# Patient Record
Sex: Female | Born: 1980 | Race: White | Hispanic: No | Marital: Single | Smoking: Current every day smoker
Health system: Southern US, Community
[De-identification: ages and names within clinical notes are randomized; demographics above are authoritative.]

## PROBLEM LIST (undated history)

## (undated) ENCOUNTER — Inpatient Hospital Stay (HOSPITAL_COMMUNITY): Payer: Self-pay

## (undated) DIAGNOSIS — R87619 Unspecified abnormal cytological findings in specimens from cervix uteri: Secondary | ICD-10-CM

## (undated) DIAGNOSIS — R87629 Unspecified abnormal cytological findings in specimens from vagina: Secondary | ICD-10-CM

## (undated) DIAGNOSIS — R51 Headache: Secondary | ICD-10-CM

## (undated) DIAGNOSIS — I499 Cardiac arrhythmia, unspecified: Secondary | ICD-10-CM

## (undated) DIAGNOSIS — Z3483 Encounter for supervision of other normal pregnancy, third trimester: Secondary | ICD-10-CM

## (undated) DIAGNOSIS — IMO0002 Reserved for concepts with insufficient information to code with codable children: Secondary | ICD-10-CM

## (undated) DIAGNOSIS — F419 Anxiety disorder, unspecified: Secondary | ICD-10-CM

## (undated) DIAGNOSIS — R519 Headache, unspecified: Secondary | ICD-10-CM

## (undated) DIAGNOSIS — C801 Malignant (primary) neoplasm, unspecified: Secondary | ICD-10-CM

## (undated) DIAGNOSIS — Z9851 Tubal ligation status: Secondary | ICD-10-CM

## (undated) HISTORY — PX: NO PAST SURGERIES: SHX2092

## (undated) HISTORY — PX: CARDIAC ELECTROPHYSIOLOGY MAPPING AND ABLATION: SHX1292

## (undated) HISTORY — DX: Headache: R51

## (undated) HISTORY — PX: GYNECOLOGIC CRYOSURGERY: SHX857

## (undated) HISTORY — DX: Unspecified abnormal cytological findings in specimens from vagina: R87.629

## (undated) HISTORY — DX: Headache, unspecified: R51.9

---
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
## 2001-02-07 ENCOUNTER — Emergency Department (HOSPITAL_COMMUNITY): Admission: EM | Admit: 2001-02-07 | Discharge: 2001-02-08 | Payer: Self-pay | Admitting: Emergency Medicine

## 2001-02-07 ENCOUNTER — Encounter: Payer: Self-pay | Admitting: Emergency Medicine

## 2001-10-15 ENCOUNTER — Other Ambulatory Visit: Admission: RE | Admit: 2001-10-15 | Discharge: 2001-10-15 | Payer: Self-pay | Admitting: Gynecology

## 2002-04-20 ENCOUNTER — Other Ambulatory Visit: Admission: RE | Admit: 2002-04-20 | Discharge: 2002-04-20 | Payer: Self-pay | Admitting: Gynecology

## 2005-02-01 ENCOUNTER — Emergency Department (HOSPITAL_COMMUNITY): Admission: EM | Admit: 2005-02-01 | Discharge: 2005-02-01 | Payer: Self-pay | Admitting: Emergency Medicine

## 2005-02-20 ENCOUNTER — Encounter: Admission: RE | Admit: 2005-02-20 | Discharge: 2005-02-20 | Payer: Self-pay | Admitting: *Deleted

## 2005-02-27 ENCOUNTER — Ambulatory Visit (HOSPITAL_COMMUNITY): Admission: RE | Admit: 2005-02-27 | Discharge: 2005-02-28 | Payer: Self-pay | Admitting: *Deleted

## 2006-01-24 ENCOUNTER — Other Ambulatory Visit: Admission: RE | Admit: 2006-01-24 | Discharge: 2006-01-24 | Payer: Self-pay | Admitting: Obstetrics & Gynecology

## 2007-09-16 ENCOUNTER — Emergency Department (HOSPITAL_COMMUNITY): Admission: EM | Admit: 2007-09-16 | Discharge: 2007-09-16 | Payer: Self-pay | Admitting: Emergency Medicine

## 2010-10-11 ENCOUNTER — Emergency Department (HOSPITAL_COMMUNITY): Admission: EM | Admit: 2010-10-11 | Discharge: 2010-10-11 | Payer: Self-pay | Admitting: Emergency Medicine

## 2011-03-15 LAB — URINALYSIS, ROUTINE W REFLEX MICROSCOPIC
Nitrite: POSITIVE — AB
Specific Gravity, Urine: 1.032 — ABNORMAL HIGH (ref 1.005–1.030)
pH: 6 (ref 5.0–8.0)

## 2011-03-15 LAB — URINE MICROSCOPIC-ADD ON

## 2011-05-18 NOTE — Cardiovascular Report (Signed)
NAMEFALLON, Kathy [name]                  ACCOUNT NO.:  [id number]   MEDICAL RECORD NO.:  [id number]          PATIENT TYPE:  OIB   LOCATION:  4731                         FACILITY:  [facility]   PHYSICIAN:  Mark E. Severiano Gilbert, M.D.    DATE OF BIRTH:  1981/06/10   DATE OF PROCEDURE:  02/27/2005  DATE OF DISCHARGE:                              CARDIAC CATHETERIZATION   PROCEDURE PERFORMED:  1.  Comprehensive electrophysiologic evaluation with arrhythmia induction.  2.  Left atrial pacing from the coronary sinus.  3.  Programmed electrical stimulation after an infusion of isoproterenol.  4.  Catheter mapping for ablation site.  5.  Radiofrequency ablation of atrioventricular slow pathway.   PREPROCEDURE DIAGNOSES:  Paroxysmal supraventricular tachycardia.   POSTPROCEDURE DIAGNOSES:  Typical atrioventricular nodal reentrant  tachycardia.   OPERATOR:  Launa Grill, MD.   COMPLICATIONS:  None.   ESTIMATED BLOOD LOSS:  Less than 30 mL.   PROCEDURE IN DETAIL:  The patient was brought to the EP Laboratory in a  fasting state.  The patient was prepped and draped in the usual manner.  Two  7 French sheaths were placed into the left femoral vein after anesthesia was  obtained with 1% local lidocaine using a  thin-wall needle technique.  Two 7  French sheaths were then placed in the right femoral vein using similar  technique.  Under fluoroscopic guidance a decapolar catheter was advanced  into the proximal coronary sinus, a hexapolar catheter was advanced into the  his-bundle area, a quadripolar catheter was placed within the right  ventricular apex, and a quadripolar catheter was placed within the right  atrial appendage.  Programmed electrical stimulation from the atrium and  ventricle, as well as left atrium was performed both with and without  isoproterenol.  Baseline findings demonstrated normal sinus rhythm cycle  length 877 milliseconds, normal PR normal QRS, and normal QT.  The measured  AH  interval was 58 milliseconds, the measured HV interval was 54  milliseconds.  There was no evidence of ventricular preexcitation.  The  retrograde block cycle length was 500 milliseconds.  It was noted during  testing of the retrograde AV node ERP atypical echo beats were frequently  generated consistent with dual AV node physiology.  The retrograde ERP was  390 milliseconds off the drive train of 604 milliseconds from the right  ventricular apex.  Antegrade pacing from the right atrium demonstrated a  fast pathway ERP of 450 milliseconds off a drive train of 540, and the slow  pathway ERP was noted to be less than 330 milliseconds.  Single prematures  coupled at 330 milliseconds to the drive train of 981 milliseconds multiply  and reducibly initiated nonsustained runs of a narrow complex regular  tachycardia with a VA time approximately 70 milliseconds, earliest  retrograde atrial activation was noted in the midline and was consistent  with typical AVNRT.  Similar pacing strategy after infusing isoproterenol  allowed there to be sustained tachycardia cycle length to about 430  milliseconds.  His refractory PVCs failed to preexcite the atrium and a  preexcitation index  was quite large.  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  Now RA catheter was exchanged for an EPT  asymmetric 4 thermal sensing radiofrequency ablation catheter.  The  tricuspid annulus in the mouth of the coronary sinus were extensively  mapped.  Multiple short radiofrequency burns were made in the area of M1 and  M2, both above and below the os of the coronary sinus with frequent  junctional beats.  Initially the junctional beats initiated the tachycardia,  but after several burns, junctional beats failed to initiate the typical  AVNRT.  After waiting approximately 20 minutes, post radiofrequency EP study  was performed.  There was no ability to induce tachycardia.  This is  consistent with  successful slow pathway modification.  Post EP measurements  demonstrated normal sinus rhythm with an AH interval of 58 milliseconds, HV  interval 54 milliseconds.  The retrograde block cycle length was less than  400 milliseconds.  The retrograde ventricular effective refractory period  was 330 milliseconds off the drive train of 161 milliseconds.  There were no  atypical echo beats generated from right ventricular apex pacing.  The  atrial ERP was noted to be 260 milliseconds off a drive train of 096  milliseconds.  The antegrade AV node properties were very poor at baseline  secondary to patient sedation and low adrenergic state.  Infusion of Isuprel  1 mcg demonstrated a fast pathway ERP of 310 milliseconds off a drive train  of 045, and the atrial ERP was 230 milliseconds off a drive train of 409  milliseconds.  Antegrade slow pathway ERP was therefore less than 230  milliseconds.  Both with and without Isuprel there was no induction of  rhythm or even single echo beats.  The patient tolerated the procedure well  and all catheters and sheaths were removed.  Hemostasis obtained by direct  pressure and the patient was returned to the holding area in stable  hemodynamic condition.   IMPRESSION:  Typical atrioventricular nodal reentrant tachycardia.   CONCLUSION:  Successful radiofrequency ablation of the slow pathway with  altered slow pathway characteristics no longer able to participate in a  reciprocating tachycardia.      MEP/MEDQ  D:  02/27/2005  T:  02/27/2005  Job:  [id number]

## 2011-10-11 LAB — DIFFERENTIAL
Eosinophils Absolute: 0
Eosinophils Relative: 0
Lymphocytes Relative: 34
Lymphs Abs: 2.2
Monocytes Relative: 6

## 2011-10-11 LAB — URINALYSIS, ROUTINE W REFLEX MICROSCOPIC
Bilirubin Urine: NEGATIVE
Ketones, ur: NEGATIVE
Protein, ur: NEGATIVE
Urobilinogen, UA: 1

## 2011-10-11 LAB — CBC
HCT: 38.2
MCV: 89
RBC: 4.29
WBC: 6.4

## 2011-10-11 LAB — BASIC METABOLIC PANEL
Chloride: 105
GFR calc Af Amer: >60
GFR calc non Af Amer: >60
Potassium: 3.7
Sodium: 136

## 2011-10-11 LAB — POCT PREGNANCY, URINE
Operator id: 29011
Preg Test, Ur: NEGATIVE

## 2013-07-10 LAB — OB RESULTS CONSOLE ANTIBODY SCREEN: Antibody Screen: NEGATIVE

## 2013-07-10 LAB — OB RESULTS CONSOLE GC/CHLAMYDIA
CHLAMYDIA, DNA PROBE: NEGATIVE
Gonorrhea: NEGATIVE

## 2013-07-10 LAB — OB RESULTS CONSOLE ABO/RH: RH Type: POSITIVE

## 2013-07-10 LAB — OB RESULTS CONSOLE HIV ANTIBODY (ROUTINE TESTING): HIV: NONREACTIVE

## 2013-07-10 LAB — OB RESULTS CONSOLE RUBELLA ANTIBODY, IGM: RUBELLA: IMMUNE

## 2013-07-10 LAB — OB RESULTS CONSOLE HEPATITIS B SURFACE ANTIGEN: HEP B S AG: NEGATIVE

## 2013-07-10 LAB — OB RESULTS CONSOLE RPR: RPR: NONREACTIVE

## 2013-10-09 ENCOUNTER — Emergency Department (HOSPITAL_COMMUNITY)
Admission: EM | Admit: 2013-10-09 | Discharge: 2013-10-09 | Disposition: A | Discharge status: Home / Self Care | Payer: Medicaid Other | Attending: Emergency Medicine | Admitting: Emergency Medicine

## 2013-10-09 ENCOUNTER — Encounter (HOSPITAL_COMMUNITY): Payer: Self-pay | Admitting: Emergency Medicine

## 2013-10-09 ENCOUNTER — Other Ambulatory Visit: Payer: Self-pay

## 2013-10-09 ENCOUNTER — Emergency Department (HOSPITAL_COMMUNITY): Payer: Medicaid Other

## 2013-10-09 DIAGNOSIS — I471 Supraventricular tachycardia: Secondary | ICD-10-CM

## 2013-10-09 DIAGNOSIS — R42 Dizziness and giddiness: Secondary | ICD-10-CM | POA: Insufficient documentation

## 2013-10-09 DIAGNOSIS — I498 Other specified cardiac arrhythmias: Secondary | ICD-10-CM | POA: Insufficient documentation

## 2013-10-09 DIAGNOSIS — O9989 Other specified diseases and conditions complicating pregnancy, childbirth and the puerperium: Secondary | ICD-10-CM | POA: Insufficient documentation

## 2013-10-09 DIAGNOSIS — R0602 Shortness of breath: Secondary | ICD-10-CM | POA: Insufficient documentation

## 2013-10-09 DIAGNOSIS — Z79899 Other long term (current) drug therapy: Secondary | ICD-10-CM | POA: Insufficient documentation

## 2013-10-09 DIAGNOSIS — Z3490 Encounter for supervision of normal pregnancy, unspecified, unspecified trimester: Secondary | ICD-10-CM

## 2013-10-09 DIAGNOSIS — O99330 Smoking (tobacco) complicating pregnancy, unspecified trimester: Secondary | ICD-10-CM | POA: Insufficient documentation

## 2013-10-09 HISTORY — DX: Reserved for concepts with insufficient information to code with codable children: IMO0002

## 2013-10-09 HISTORY — DX: Unspecified abnormal cytological findings in specimens from cervix uteri: R87.619

## 2013-10-09 HISTORY — DX: Cardiac arrhythmia, unspecified: I49.9

## 2013-10-09 LAB — CBC WITH DIFFERENTIAL/PLATELET
Eosinophils Absolute: 0.1 10*3/uL (ref 0.0–0.7)
Eosinophils Relative: 1 % (ref 0–5)
HCT: 37 % (ref 36.0–46.0)
Lymphocytes Relative: 24 % (ref 12–46)
Lymphs Abs: 2.7 10*3/uL (ref 0.7–4.0)
MCH: 31.1 pg (ref 26.0–34.0)
MCV: 89.2 fL (ref 78.0–100.0)
Monocytes Absolute: 0.9 10*3/uL (ref 0.1–1.0)
Platelets: 299 10*3/uL (ref 150–400)
RBC: 4.15 MIL/uL (ref 3.87–5.11)
RDW: 13.4 % (ref 11.5–15.5)
WBC: 11.3 10*3/uL — ABNORMAL HIGH (ref 4.0–10.5)

## 2013-10-09 LAB — BASIC METABOLIC PANEL
BUN: 6 mg/dL (ref 6–23)
CO2: 18 mEq/L — ABNORMAL LOW (ref 19–32)
Calcium: 9.2 mg/dL (ref 8.4–10.5)
Creatinine, Ser: 0.41 mg/dL — ABNORMAL LOW (ref 0.50–1.10)
GFR calc non Af Amer: >90 mL/min (ref >90)
Glucose, Bld: 73 mg/dL (ref 70–99)
Sodium: 139 mEq/L (ref 135–145)

## 2013-10-09 LAB — POCT I-STAT TROPONIN I: Troponin i, poc: 0 ng/mL (ref 0.00–0.08)

## 2013-10-09 MED ORDER — SODIUM CHLORIDE 0.9 % IV BOLUS (SEPSIS)
1000.0000 mL | Freq: Once | INTRAVENOUS | Status: AC
  Administered 2013-10-09: 1000 mL via INTRAVENOUS

## 2013-10-09 MED ORDER — ADENOSINE 6 MG/2ML IV SOLN
6.0000 mg | Freq: Once | INTRAVENOUS | Status: AC
  Administered 2013-10-09: 6 mg via INTRAVENOUS
  Filled 2013-10-09: qty 2

## 2013-10-09 MED ORDER — ADENOSINE 6 MG/2ML IV SOLN
INTRAVENOUS | Status: AC
  Filled 2013-10-09: qty 2

## 2013-10-09 MED ORDER — SODIUM CHLORIDE 0.9 % IV BOLUS (SEPSIS): 1000.0000 mL | Freq: Once | INTRAVENOUS | Status: DC

## 2013-10-09 MED ORDER — METOPROLOL TARTRATE 12.5 MG HALF TABLET: 12.5000 mg | ORAL_TABLET | Freq: Two times a day (BID) | ORAL | Status: DC

## 2013-10-09 NOTE — ED Provider Notes (Signed)
CRITICAL CARE Performed by: Nelia Shi Total critical care time: 30 min Critical care time was exclusive of separately billable procedures and treating other patients. Critical care was necessary to treat or prevent imminent or life-threatening deterioration. Critical care was time spent personally by me on the following activities: development of treatment plan with patient and/or surrogate as well as nursing, discussions with consultants, evaluation of patient's response to treatment, examination of patient, obtaining history from patient or surrogate, ordering and performing treatments and interventions, ordering and review of laboratory studies, ordering and review of radiographic studies, pulse oximetry and re-evaluation of patient's condition.   I saw and evaluated the patient, reviewed the resident's note and I agree with the findings and plan.   .Face to face Exam:  General:  Awake HEENT:  Atraumatic Resp:  Normal effort Abd:  Nondistended Neuro:No focal weakness   Nelia Shi, MD 10/09/13 1513

## 2013-10-09 NOTE — ED Notes (Signed)
Pt brought to trauma room via wheelchair; pt getting into a gown at this time; RN and NT are in the room with pt now

## 2013-10-09 NOTE — ED Provider Notes (Signed)
CSN: [id number]     Arrival date & time 10/09/13  1212 History   First MD Initiated Contact with Patient 10/09/13 1225     Chief Complaint  Patient presents with  . Tachycardia   (Consider location/radiation/quality/duration/timing/severity/associated sxs/prior Treatment) Patient is a 32 y.o. female presenting with palpitations. The history is provided by the patient.  Palpitations Palpitations quality:  Fast Onset quality:  Sudden Timing:  Constant Progression:  Worsening Chronicity:  Recurrent Context: nicotine   Context: not anxiety, not caffeine and not stimulant use   Relieved by:  Nothing Worsened by:  Nothing tried Ineffective treatments:  Breathing exercises and valsalva Associated symptoms: dizziness, nausea and shortness of breath   Associated symptoms: no back pain, no chest pressure, no diaphoresis, no leg pain, no lower extremity edema, no malaise/fatigue, no orthopnea, no vomiting and no weakness     Past Medical History  Diagnosis Date  . Dysrhythmia     svt  . Abnormal Pap smear     cryo/ freezing, normal pap after   Past Surgical History  Procedure Laterality Date  . Cardiac electrophysiology mapping and ablation     History reviewed. No pertinent family history. History  Substance Use Topics  . Smoking status: Current Every Day Smoker -- 0.50 packs/day    Types: Cigarettes  . Smokeless tobacco: Not on file  . Alcohol Use: No   OB History   Grav Para Term Preterm Abortions TAB SAB Ect Mult Living   1              Review of Systems  Constitutional: Negative for malaise/fatigue and diaphoresis.  Respiratory: Positive for shortness of breath.   Cardiovascular: Positive for palpitations. Negative for orthopnea.  Gastrointestinal: Positive for nausea. Negative for vomiting.  Musculoskeletal: Negative for back pain.  Neurological: Positive for dizziness.  All other systems reviewed and are negative.    Allergies  Review of patient's allergies  indicates no known allergies.  Home Medications   Current Outpatient Rx  Name  Route  Sig  Dispense  Refill  . acetaminophen (TYLENOL) 500 MG tablet   Oral   Take 500 mg by mouth once.         . Prenatal Vit-Fe Fumarate-FA (PRENATAL MULTIVITAMIN) TABS tablet   Oral   Take 1 tablet by mouth daily at 12 noon.         . Prenatal Vit-Fe Fumarate-FA (PRENATAL PO)   Oral   Take 1 capsule by mouth daily.         . metoprolol (LOPRESSOR) 12.5 mg TABS tablet   Oral   Take 0.5 tablets (12.5 mg total) by mouth 2 (two) times daily.   30 tablet   1    BP 110/64  Pulse 110  Temp(Src) 97.8 F (36.6 C) (Oral)  Resp 19  SpO2 100% Physical Exam  Nursing note and vitals reviewed. Constitutional: She is oriented to person, place, and time. She appears well-developed and well-nourished. She appears distressed (in moderate discomfort).  HENT:  Head: Normocephalic and atraumatic.  Mouth/Throat: Oropharynx is clear and moist. No oropharyngeal exudate.  Eyes: Conjunctivae and EOM are normal. Pupils are equal, round, and reactive to light.  Neck: Normal range of motion. Neck supple.  Cardiovascular: Normal rate, regular rhythm and normal heart sounds.  Exam reveals no gallop and no friction rub.   No murmur heard. Pulmonary/Chest: Effort normal and breath sounds normal. No respiratory distress. She has no wheezes. She has no rales. She exhibits no tenderness.  Abdominal: Soft. She exhibits no distension. There is no tenderness.  Gravid  Musculoskeletal: Normal range of motion. She exhibits no edema and no tenderness.  Lymphadenopathy:    She has no cervical adenopathy.  Neurological: She is alert and oriented to person, place, and time.  Skin: Skin is warm and dry. No rash noted. She is not diaphoretic.  Psychiatric: She has a normal mood and affect. Her behavior is normal. Judgment and thought content normal.    ED Course  Procedures (including critical care time) Labs Review Labs  Reviewed  CBC WITH DIFFERENTIAL - Abnormal; Notable for the following:    WBC 11.3 (*)    All other components within normal limits  BASIC METABOLIC PANEL - Abnormal; Notable for the following:    Potassium 3.3 (*)    CO2 18 (*)    Creatinine, Ser 0.41 (*)    All other components within normal limits  TSH  POCT I-STAT TROPONIN I   Imaging Review Dg Chest Port 1 View  10/09/2013   CLINICAL DATA:  Pregnancy. Tachycardia. Elevated heart rate.  EXAM: PORTABLE CHEST - 1 VIEW  COMPARISON:  09/16/2007.  FINDINGS: The heart size and mediastinal contours are within normal limits. Both lungs are clear. The visualized skeletal structures are unremarkable.  IMPRESSION: No active disease.   Electronically Signed   By: Andreas Newport M.D.   On: 10/09/2013 14:47    EKG Interpretation   None       Date: 10/09/2013  Rate: 155  Rhythm: supraventricular tachycardia (SVT)  QRS Axis: normal  Intervals: normal  ST/T Wave abnormalities: nonspecific ST changes  Conduction Disutrbances:none  Narrative Interpretation:   Old EKG Reviewed: none available    MDM   1. SVT (supraventricular tachycardia)   2. Pregnancy     The patient is a 32 year old female with a history of SVT status post ablation 2 years ago who presents with SVT. Patient is 26-1/[redacted] weeks pregnant with her first pregnancy. Prior to arrival, had acute onset of shortness of breath and palpitations. She has had this previously and has responded to vagal maneuvers at home. Since her ablation, she has had less frequent episodes. He denies any recent changes to her diet or intake. Denies caffeine or stimulant use.  On arrival patient is tachycardic to 155. Tachycardia fluctuates between 130 and 160. Initial blood pressure 116/68. She is mildly symptomatic with some lightheadedness and dizziness as well as dyspnea. Denies chest pain. Bedside ultrasound shows good fetal movement, fetal cardiac activity with rate of 140, anterior placenta,  acceptable amniotic fluid. Based on reassuring ultrasound, patient was placed on toco monitor. Discussed with cardiology for possible adenosine versus observational management. Based on lack of hard data for adenosine, as well as atypical heart rate for SVT in the 130s, initial management included fluid hydration as well as basic labs, troponin. Chest x-ray.  EMERGENCY DEPARTMENT [place] PREGNANCY "Study: Limited Ultrasound of the Pelvis"  INDICATIONS:Pregnancy(required) and Tachycardia Multiple views of the uterus and pelvic cavity are obtained with a multi-frequency probe.  APPROACH:Transabdominal   PERFORMED BY: Myself  IMAGES ARCHIVED?: No  LIMITATIONS: Emergent procedure  PREGNANCY FREE FLUID: None  PREGNANCY UTERUS FINDINGS:Uterus enlarged and Gestational sac noted ADNEXAL FINDINGS:Left ovary not seen and Right ovary not seen  PREGNANCY FINDINGS: Fetal heart activity seen  INTERPRETATION: Viable intrauterine pregnancy  GESTATIONAL AGE, ESTIMATE: 27 weeks  FETAL HEART RATE: 140  COMMENT(Estimate of Gestational Age):   Chest x-ray shows no consolidation, pneumothorax, widened mediastinum, effusion. Laboratory  results show a mild leukocytosis of 11 which is likely stress reaction. Mild hypokalemia 3.3 but otherwise no signs of renal impairment or electrolyte abnormality. Troponin negative. TSH pending. On continued assessment of the patient, her tachycardia remained elevated, climbing in the 160s. At that point as well as with increasing discomfort, discussion was had with the patient and she agreed to adenosine treatment. Following adenosine treatment, her tachycardia improved to 110. Fluid administration was continued cardiology was consulted. They would like to start her on metoprolol 12.5 twice a day he went home. He'll followup with her as an outpatient. OB rechecked and her cervix remains closed. From their perspective, she is stable for discharge. She will followup with her OB as  well as cardiology. Patient stable for discharge.  This patient was discussed with my attending, Dr. Radford Pax.    Dorna Leitz, MD 10/09/13 [phone]

## 2013-10-09 NOTE — ED Notes (Signed)
Pt reports onset of SVT, hx of same. Pt [redacted] weeks pregnant. No cramping, reports pressure but states she needs to pee. Pt's HR in 150-160's. MD at bedside.

## 2013-10-09 NOTE — ED Notes (Signed)
Family at bedside. 

## 2013-10-09 NOTE — Progress Notes (Signed)
1240 Arrived to evaluate this 32 yo G1 P1 at 26.[redacted] wks GA complaining of rapid heart rate and shortness of breath.  History of SVT and cardiac ablation procedure.  R/O SVT now.1330 Spoke with Dr. Ambrose Mantle about patient, updated on FHR, patient with complaint of UC's and of SVE.  Also updated on ED plan of care at present.  Recommends OBRR recheck SVE in 1 hour, if FHR reactive, SVE unchanged, and SVT resolved, patient will be OB cleared.1402  Adenosine given per ED RN's, SVT converted to NSR. 1430  FHR reactive, UC's spaced out, SVE unchanged.  Patient OB cleared.

## 2013-10-09 NOTE — ED Notes (Signed)
OB RN at bedside

## 2013-10-14 ENCOUNTER — Encounter: Payer: Self-pay | Admitting: *Deleted

## 2013-10-15 ENCOUNTER — Ambulatory Visit (INDEPENDENT_AMBULATORY_CARE_PROVIDER_SITE_OTHER): Payer: Medicaid Other | Admitting: Internal Medicine

## 2013-10-15 ENCOUNTER — Encounter: Payer: Self-pay | Admitting: Internal Medicine

## 2013-10-15 VITALS — BP 99/66 | HR 95 | Ht 66.0 in | Wt 128.4 lb

## 2013-10-15 DIAGNOSIS — R002 Palpitations: Secondary | ICD-10-CM

## 2013-10-15 DIAGNOSIS — I2510 Atherosclerotic heart disease of native coronary artery without angina pectoris: Secondary | ICD-10-CM

## 2013-10-15 DIAGNOSIS — I471 Supraventricular tachycardia: Secondary | ICD-10-CM

## 2013-10-15 DIAGNOSIS — I519 Heart disease, unspecified: Secondary | ICD-10-CM

## 2013-10-15 DIAGNOSIS — I498 Other specified cardiac arrhythmias: Secondary | ICD-10-CM

## 2013-10-15 NOTE — Patient Instructions (Signed)
Your physician recommends that you schedule a follow-up appointment in: 6 weeks with Dr. Taylor  Your physician recommends that you continue on your current medications as directed. Please refer to the Current Medication list given to you today.   

## 2013-10-18 DIAGNOSIS — I471 Supraventricular tachycardia: Secondary | ICD-10-CM | POA: Insufficient documentation

## 2013-10-18 DIAGNOSIS — I519 Heart disease, unspecified: Secondary | ICD-10-CM | POA: Insufficient documentation

## 2013-10-18 NOTE — Progress Notes (Signed)
      HPI The patient is a very pleasant 32 year old woman whose health is otherwise been good. She has ongoing tobacco abuse. She is in her third trimester of pregnancy. She presented to the hospital several days ago with recurrent tachypalpitations. She had a short RP tachycardia and was treated with adenosine. The history dates back several years when she underwent catheter ablation. Since then, she had very infrequent episodes of palpitations. She has had no episodes of palpitations since her pregnancy began until presenting to the emergency room several days ago. When she is in SVT, she feels short of breath. She denies syncope. I do not have details of her prior SVT ablation, as it was performed by another physician. Under my phone consultation, the patient was placed on low-dose beta blocker therapy when she was seen in the emergency room several days ago. No Known Allergies   Current Outpatient Prescriptions  Medication Sig Dispense Refill  . acetaminophen (TYLENOL) 500 MG tablet Take 500 mg by mouth as needed.       . Ferrous Sulfate (IRON) 28 MG TABS Take 1 tablet by mouth daily.      . metoprolol (LOPRESSOR) 12.5 mg TABS tablet Take 0.5 tablets (12.5 mg total) by mouth 2 (two) times daily.  30 tablet  1  . Prenatal Vit-Fe Fumarate-FA (PRENATAL MULTIVITAMIN) TABS tablet Take 1 tablet by mouth daily at 12 noon.      . Prenatal Vit-Fe Fumarate-FA (PRENATAL PO) Take 1 capsule by mouth daily.       No current facility-administered medications for this visit.     Past Medical History  Diagnosis Date  . Dysrhythmia     svt  . Abnormal Pap smear     cryo/ freezing, normal pap after    ROS:   All systems reviewed and negative except as noted in the HPI.   Past Surgical History  Procedure Laterality Date  . Cardiac electrophysiology mapping and ablation       No family history on file.   History   Social History  . Marital Status: Single    Spouse Name: N/A    Number  of Children: N/A  . Years of Education: N/A   Occupational History  . Not on file.   Social History Main Topics  . Smoking status: Current Every Day Smoker -- 0.50 packs/day    Types: Cigarettes  . Smokeless tobacco: Not on file  . Alcohol Use: No  . Drug Use: No  . Sexual Activity: Yes   Other Topics Concern  . Not on file   Social History Narrative  . No narrative on file     BP 99/66  Pulse 95  Ht 5\' 6"  (1.676 m)  Wt 128 lb 6.4 oz (58.242 kg)  BMI 20.73 kg/m2  Physical Exam:  Well appearing 32 year old woman,NAD HEENT: Unremarkable except for very poor dentition Neck:  No JVD, no thyromegally Lymphatics:  No adenopathy Back:  No CVA tenderness Lungs:  Clear with no wheezes, rales, or rhonchi. HEART:  Regular rate rhythm, no murmurs, no rubs, no clicks Abd:  soft, positive bowel sounds, no organomegally, no rebound, no guarding, gravid Ext:  2 plus pulses, no edema, no cyanosis, no clubbing Skin:  No rashes no nodules Neuro:  CN II through XII intact, motor grossly intact  EKG - normal sinus rhythm with no preexcitation   Assess/Plan:

## 2013-10-18 NOTE — Assessment & Plan Note (Signed)
She will continue low-dose beta blocker therapy. We will plan to continue this medication until she is a few days from delivery.

## 2013-10-18 NOTE — Assessment & Plan Note (Signed)
Her pregnancy has thus far been uncomplicated with the exception of her single episode of SVT. She will undergo watchful waiting.

## 2013-11-30 ENCOUNTER — Encounter: Payer: Self-pay | Admitting: Internal Medicine

## 2013-11-30 ENCOUNTER — Encounter: Payer: Self-pay | Admitting: *Deleted

## 2013-11-30 ENCOUNTER — Ambulatory Visit (INDEPENDENT_AMBULATORY_CARE_PROVIDER_SITE_OTHER): Payer: Medicaid Other | Admitting: Internal Medicine

## 2013-11-30 VITALS — BP 89/62 | HR 100 | Ht 66.0 in | Wt 133.8 lb

## 2013-11-30 DIAGNOSIS — R002 Palpitations: Secondary | ICD-10-CM

## 2013-11-30 DIAGNOSIS — I2510 Atherosclerotic heart disease of native coronary artery without angina pectoris: Secondary | ICD-10-CM

## 2013-11-30 DIAGNOSIS — I471 Supraventricular tachycardia: Secondary | ICD-10-CM

## 2013-11-30 DIAGNOSIS — I519 Heart disease, unspecified: Secondary | ICD-10-CM

## 2013-11-30 NOTE — Progress Notes (Signed)
      HPI Kathy [name] returns today for followup. She is a pleasant 32 yo woman with a h/o SVT s/p ablation, who is now in her third trimester of pregnancy and developed recurrent SVT. When I saw her last several weeks ago, we placed her on low dose beta blockers. She has done well in the interim. She denies chest pain, shortness of breath, or syncope. No more symptomatic SVT. She is [redacted] weeks gestation. No Known Allergies   Current Outpatient Prescriptions  Medication Sig Dispense Refill  . acetaminophen (TYLENOL) 500 MG tablet Take 500 mg by mouth as needed.       . Ferrous Sulfate (IRON) 28 MG TABS Take 1 tablet by mouth daily.      . metoprolol (LOPRESSOR) 12.5 mg TABS tablet Take 0.5 tablets (12.5 mg total) by mouth 2 (two) times daily.  30 tablet  1  . Prenatal Vit-Fe Fumarate-FA (PRENATAL MULTIVITAMIN) TABS tablet Take 1 tablet by mouth daily at 12 noon.      . Prenatal Vit-Fe Fumarate-FA (PRENATAL PO) Take 1 capsule by mouth daily.       No current facility-administered medications for this visit.     Past Medical History  Diagnosis Date  . Dysrhythmia     svt  . Abnormal Pap smear     cryo/ freezing, normal pap after    ROS:   All systems reviewed and negative except as noted in the HPI.   Past Surgical History  Procedure Laterality Date  . Cardiac electrophysiology mapping and ablation       No family history on file.   History   Social History  . Marital Status: Single    Spouse Name: N/A    Number of Children: N/A  . Years of Education: N/A   Occupational History  . Not on file.   Social History Main Topics  . Smoking status: Current Every Day Smoker -- 0.50 packs/day    Types: Cigarettes  . Smokeless tobacco: Not on file  . Alcohol Use: No  . Drug Use: No  . Sexual Activity: Yes   Other Topics Concern  . Not on file   Social History Narrative  . No narrative on file     BP 89/62  Pulse 100  Ht 5\' 6"  (1.676 m)  Wt 133 lb 12.8 oz  (60.691 kg)  BMI 21.61 kg/m2  Physical Exam:  Well appearing pregnant, young woman, NAD HEENT: Unremarkable Neck:  No JVD, no thyromegally Back:  No CVA tenderness Lungs:  Clear with no wheezes, rales, or rhonchi. HEART:  Regular rate rhythm, no murmurs, no rubs, no clicks Abd:  soft, positive bowel sounds, gravid, no organomegally, no rebound, no guarding Ext:  2 plus pulses, no edema, no cyanosis, no clubbing Skin:  No rashes no nodules Neuro:  CN II through XII intact, motor grossly intact  Assess/Plan:

## 2013-11-30 NOTE — Assessment & Plan Note (Signed)
I've asked the patient to continue taking her beta blocker at the current dose until January 1. She has been instructed to reduce her dose to one half tablet daily. When she goes into labor, she will discontinue her beta blocker altogether.

## 2013-11-30 NOTE — Patient Instructions (Addendum)
Your physician has recommended you make the following change in your medication:  1) January 1 - reduce Metoprolol to 1/2 tablet daily. Once labor begins, stop taking Metoprolol.  Your physician recommends that you schedule a follow-up appointment in: as needed.

## 2013-12-29 ENCOUNTER — Telehealth: Payer: Self-pay | Admitting: Internal Medicine

## 2013-12-29 NOTE — Telephone Encounter (Signed)
New problem   Pt calling c/o sob   Pt obgyn wanted her to touch bases with dr to see what he thinks

## 2013-12-29 NOTE — Telephone Encounter (Signed)
Spoke with patient of whom is 37 almost [redacted] weeks pregnant.  She is due on 01/10/14. This is her first pregnancy.   I let her know SOB is normal at this stage of pregnancy.  She did not sound SOB over the phone and was talking fine.  She said her OB office told her it was normal also but wanted her to "touch base" with her cardiology office.  She is not having palpitations.  She was okay and reassured that this is normal

## 2013-12-31 NOTE — L&D Delivery Note (Signed)
Delivery Note At 12:48 AM a viable and healthy female was delivered via Vaginal, Spontaneous Delivery (Presentation: Left Occiput Anterior).  APGAR: 1,6, 7 ; weight 7 lb 1.2 oz (3209 g).   Placenta status: Intact, Spontaneous.  Cord: 3 vessels with the following complications: None.  Cord pH: 7.249 venous  Anesthesia: Epidural  Episiotomy: None Lacerations: 2nd degree;Labial Suture Repair: 3.0 vicryl rapide Est. Blood Loss (mL): 400cc  Mom to postpartum.  Baby to Couplet care / Skin to Skin.  BOVARD,Kathy [name] 01/11/2014, 1:24 AM  Br / O+ / RI / Contra?  Pt and FOB desires circumcision for female infant d/w r/b/a.

## 2014-01-09 ENCOUNTER — Inpatient Hospital Stay (HOSPITAL_COMMUNITY): Payer: Medicaid Other | Admitting: Anesthesiology

## 2014-01-09 ENCOUNTER — Encounter (HOSPITAL_COMMUNITY): Payer: Medicaid Other | Admitting: Anesthesiology

## 2014-01-09 ENCOUNTER — Encounter (HOSPITAL_COMMUNITY): Payer: Self-pay | Admitting: *Deleted

## 2014-01-09 ENCOUNTER — Inpatient Hospital Stay (HOSPITAL_COMMUNITY)
Admission: AD | Admit: 2014-01-09 | Discharge: 2014-01-13 | DRG: 774 | Disposition: A | Discharge status: Home / Self Care | Payer: Medicaid Other | Source: Ambulatory Visit | Attending: Obstetrics and Gynecology | Admitting: Obstetrics and Gynecology

## 2014-01-09 DIAGNOSIS — O99334 Smoking (tobacco) complicating childbirth: Secondary | ICD-10-CM | POA: Diagnosis present

## 2014-01-09 DIAGNOSIS — O99892 Other specified diseases and conditions complicating childbirth: Secondary | ICD-10-CM | POA: Diagnosis present

## 2014-01-09 DIAGNOSIS — O9989 Other specified diseases and conditions complicating pregnancy, childbirth and the puerperium: Secondary | ICD-10-CM

## 2014-01-09 DIAGNOSIS — O411090 Infection of amniotic sac and membranes, unspecified, unspecified trimester, not applicable or unspecified: Secondary | ICD-10-CM | POA: Diagnosis present

## 2014-01-09 DIAGNOSIS — IMO0001 Reserved for inherently not codable concepts without codable children: Secondary | ICD-10-CM

## 2014-01-09 DIAGNOSIS — I498 Other specified cardiac arrhythmias: Secondary | ICD-10-CM | POA: Diagnosis present

## 2014-01-09 LAB — CBC
HEMATOCRIT: 37.3 % (ref 36.0–46.0)
Hemoglobin: 13.4 g/dL (ref 12.0–15.0)
MCH: 32.3 pg (ref 26.0–34.0)
MCHC: 35.9 g/dL (ref 30.0–36.0)
MCV: 89.9 fL (ref 78.0–100.0)
Platelets: 235 10*3/uL (ref 150–400)
RBC: 4.15 MIL/uL (ref 3.87–5.11)
RDW: 13.5 % (ref 11.5–15.5)
WBC: 12.3 10*3/uL — AB (ref 4.0–10.5)

## 2014-01-09 LAB — RPR: RPR Ser Ql: NONREACTIVE

## 2014-01-09 LAB — OB RESULTS CONSOLE GBS: GBS: NEGATIVE

## 2014-01-09 MED ORDER — LIDOCAINE HCL (PF) 1 % IJ SOLN
30.0000 mL | INTRAMUSCULAR | Status: DC | PRN
  Administered 2014-01-11: 30 mL via SUBCUTANEOUS
  Filled 2014-01-09 (×2): qty 30

## 2014-01-09 MED ORDER — FENTANYL 2.5 MCG/ML BUPIVACAINE 1/10 % EPIDURAL INFUSION (WH - ANES)
14.0000 mL/h | INTRAMUSCULAR | Status: DC | PRN
  Administered 2014-01-09 – 2014-01-10 (×8): 14 mL/h via EPIDURAL
  Filled 2014-01-09 (×9): qty 125

## 2014-01-09 MED ORDER — LACTATED RINGERS IV SOLN
500.0000 mL | Freq: Once | INTRAVENOUS | Status: AC
  Administered 2014-01-09: 500 mL via INTRAVENOUS

## 2014-01-09 MED ORDER — LACTATED RINGERS IV SOLN: 500.0000 mL | INTRAVENOUS | Status: DC | PRN

## 2014-01-09 MED ORDER — EPHEDRINE 5 MG/ML INJ
10.0000 mg | INTRAVENOUS | Status: DC | PRN
  Filled 2014-01-09: qty 2
  Filled 2014-01-09: qty 4

## 2014-01-09 MED ORDER — BUTORPHANOL TARTRATE 1 MG/ML IJ SOLN
2.0000 mg | INTRAMUSCULAR | Status: DC | PRN
  Administered 2014-01-09: 2 mg via INTRAVENOUS

## 2014-01-09 MED ORDER — IBUPROFEN 600 MG PO TABS: 600.0000 mg | ORAL_TABLET | Freq: Four times a day (QID) | ORAL | Status: DC | PRN

## 2014-01-09 MED ORDER — ONDANSETRON HCL 4 MG/2ML IJ SOLN: 4.0000 mg | Freq: Four times a day (QID) | INTRAMUSCULAR | Status: DC | PRN

## 2014-01-09 MED ORDER — OXYTOCIN 40 UNITS IN LACTATED RINGERS INFUSION - SIMPLE MED
62.5000 mL/h | INTRAVENOUS | Status: DC
  Administered 2014-01-11: 62.5 mL/h via INTRAVENOUS
  Filled 2014-01-09: qty 1000

## 2014-01-09 MED ORDER — METOPROLOL TARTRATE 12.5 MG HALF TABLET
12.5000 mg | ORAL_TABLET | Freq: Two times a day (BID) | ORAL | Status: DC
  Filled 2014-01-09: qty 1

## 2014-01-09 MED ORDER — LIDOCAINE HCL (PF) 1 % IJ SOLN
INTRAMUSCULAR | Status: DC | PRN
  Administered 2014-01-09 (×2): 5 mL

## 2014-01-09 MED ORDER — PROMETHAZINE HCL 25 MG/ML IJ SOLN
12.5000 mg | Freq: Four times a day (QID) | INTRAMUSCULAR | Status: DC | PRN
  Administered 2014-01-09: 12.5 mg via INTRAVENOUS
  Filled 2014-01-09 (×2): qty 1

## 2014-01-09 MED ORDER — OXYTOCIN 40 UNITS IN LACTATED RINGERS INFUSION - SIMPLE MED
1.0000 m[IU]/min | INTRAVENOUS | Status: DC
  Administered 2014-01-09: 2 m[IU]/min via INTRAVENOUS
  Administered 2014-01-10: 10 m[IU]/min via INTRAVENOUS
  Filled 2014-01-09: qty 1000

## 2014-01-09 MED ORDER — OXYCODONE-ACETAMINOPHEN 5-325 MG PO TABS
1.0000 | ORAL_TABLET | ORAL | Status: DC | PRN
  Administered 2014-01-11 (×2): 1 via ORAL
  Filled 2014-01-09 (×2): qty 1

## 2014-01-09 MED ORDER — CITRIC ACID-SODIUM CITRATE 334-500 MG/5ML PO SOLN
30.0000 mL | ORAL | Status: DC | PRN
  Filled 2014-01-09: qty 15

## 2014-01-09 MED ORDER — BUTORPHANOL TARTRATE 1 MG/ML IJ SOLN
2.0000 mg | INTRAMUSCULAR | Status: DC | PRN
  Filled 2014-01-09: qty 2

## 2014-01-09 MED ORDER — PHENYLEPHRINE 40 MCG/ML (10ML) SYRINGE FOR IV PUSH (FOR BLOOD PRESSURE SUPPORT)
80.0000 ug | PREFILLED_SYRINGE | INTRAVENOUS | Status: DC | PRN
  Filled 2014-01-09: qty 2

## 2014-01-09 MED ORDER — DIPHENHYDRAMINE HCL 50 MG/ML IJ SOLN
12.5000 mg | INTRAMUSCULAR | Status: DC | PRN
  Administered 2014-01-10: 12.5 mg via INTRAVENOUS
  Filled 2014-01-09: qty 1

## 2014-01-09 MED ORDER — PHENYLEPHRINE 40 MCG/ML (10ML) SYRINGE FOR IV PUSH (FOR BLOOD PRESSURE SUPPORT)
80.0000 ug | PREFILLED_SYRINGE | INTRAVENOUS | Status: DC | PRN
  Filled 2014-01-09: qty 2
  Filled 2014-01-09 (×2): qty 10

## 2014-01-09 MED ORDER — TERBUTALINE SULFATE 1 MG/ML IJ SOLN: 0.2500 mg | Freq: Once | INTRAMUSCULAR | Status: AC | PRN

## 2014-01-09 MED ORDER — OXYTOCIN BOLUS FROM INFUSION: 500.0000 mL | INTRAVENOUS | Status: DC

## 2014-01-09 MED ORDER — LACTATED RINGERS IV SOLN
INTRAVENOUS | Status: DC
  Administered 2014-01-09 – 2014-01-10 (×5): via INTRAVENOUS

## 2014-01-09 MED ORDER — ACETAMINOPHEN 325 MG PO TABS
650.0000 mg | ORAL_TABLET | ORAL | Status: DC | PRN
  Filled 2014-01-09: qty 2

## 2014-01-09 MED ORDER — EPHEDRINE 5 MG/ML INJ
10.0000 mg | INTRAVENOUS | Status: DC | PRN
  Filled 2014-01-09 (×2): qty 4
  Filled 2014-01-09: qty 2

## 2014-01-09 NOTE — Anesthesia Procedure Notes (Signed)
Epidural Patient location during procedure: OB Start time: 01/09/2014 5:38 PM  Staffing Anesthesiologist: Brayton CavesJACKSON, Karishma Unrein Performed by: anesthesiologist   Preanesthetic Checklist Completed: patient identified, site marked, surgical consent, pre-op evaluation, timeout performed, IV checked, risks and benefits discussed and monitors and equipment checked  Epidural Patient position: sitting Prep: site prepped and draped and DuraPrep Patient monitoring: continuous pulse ox and blood pressure Approach: midline Injection technique: LOR air  Needle:  Needle type: Tuohy  Needle gauge: 17 G Needle length: 9 cm and 9 Needle insertion depth: 5 cm cm Catheter type: closed end flexible Catheter size: 19 Gauge Catheter at skin depth: 10 cm Test dose: negative  Assessment Events: blood not aspirated, injection not painful, no injection resistance, negative IV test and no paresthesia  Additional Notes Patient identified.  Risk benefits discussed including failed block, incomplete pain control, headache, nerve damage, paralysis, blood pressure changes, nausea, vomiting, reactions to medication both toxic or allergic, and postpartum back pain.  Patient expressed understanding and wished to proceed.  All questions were answered.  Sterile technique used throughout procedure and epidural site dressed with sterile barrier dressing. No paresthesia or other complications noted.The patient did not experience any signs of intravascular injection such as tinnitus or metallic taste in mouth nor signs of intrathecal spread such as rapid motor block. Please see nursing notes for vital signs.

## 2014-01-09 NOTE — Progress Notes (Signed)
Patient ID: Kathy [name], female   DOB: 20-Apr-1981, [age] y.o.   MRN: [id number]  Comfortable with epidural  AFVSS gen NAD SVE 1+/80-90/-1-0  FHTs 120's, mod var toco q 2-4  30cc foley bulb placed  Continue current mgmt Pitocin at 12mU Scarred cervix from cryo

## 2014-01-09 NOTE — Anesthesia Preprocedure Evaluation (Signed)
Anesthesia Evaluation  Patient identified by MRN, date of birth, ID band Patient awake    Reviewed: Allergy & Precautions, H&P , Patient's Chart, lab work & pertinent test results  Airway Mallampati: II TM Distance: >3 FB Neck ROM: full    Dental   Pulmonary Current Smoker,  breath sounds clear to auscultation        Cardiovascular + dysrhythmias Rhythm:regular Rate:Normal     Neuro/Psych    GI/Hepatic   Endo/Other    Renal/GU      Musculoskeletal   Abdominal   Peds  Hematology   Anesthesia Other Findings Paroxysmal SVT s/p ablation taken off lopressor by cardiology 1 month ago  Reproductive/Obstetrics (+) Pregnancy                           Anesthesia Physical Anesthesia Plan  ASA: II  Anesthesia Plan: Epidural   Post-op Pain Management:    Induction:   Airway Management Planned:   Additional Equipment:   Intra-op Plan:   Post-operative Plan:   Informed Consent: I have reviewed the patients History and Physical, chart, labs and discussed the procedure including the risks, benefits and alternatives for the proposed anesthesia with the patient or authorized representative who has indicated his/her understanding and acceptance.     Plan Discussed with:   Anesthesia Plan Comments:         Anesthesia Quick Evaluation

## 2014-01-09 NOTE — MAU Provider Note (Signed)
Chief Complaint:  Decreased Fetal Movement   First Provider Initiated Contact with Patient 01/09/14 0119      HPI: Kathy [name] is a 33 y.o. G1P0 at 24w6dwho presents to maternity admissions reporting feeling little fetal movement for the last several hours.  She does report contractions x2 weeks but they have gotten stronger and closer together since her OB visit today.  Her cervix was closed today in the office.  She does report 2-3 fetal movement since arriving in MAU.  She denies LOF, vaginal bleeding, vaginal itching/burning, urinary symptoms, h/a, dizziness, n/v, or fever/chills.    Pt reports she has hx of laser surgery of cervix related to cervical cancer.  She reports she was told she had a lot of scar tissue and may even have trouble getting pregnant.    While in MAU, pt reports contractions becoming stronger and closer together.  She is breathing with contractions and tearful.    Past Medical History: Past Medical History  Diagnosis Date  . Dysrhythmia     svt  . Abnormal Pap smear     cryo/ freezing, normal pap after    Past obstetric history: OB History  Gravida Para Term Preterm AB SAB TAB Ectopic Multiple Living  1             # Outcome Date GA Lbr Len/2nd Weight Sex Delivery Anes PTL Lv  1 CUR               Past Surgical History: Past Surgical History  Procedure Laterality Date  . Cardiac electrophysiology mapping and ablation      Family History: No family history on file.  Social History: History  Substance Use Topics  . Smoking status: Current Every Day Smoker -- 0.50 packs/day    Types: Cigarettes  . Smokeless tobacco: Not on file  . Alcohol Use: No    Allergies: No Known Allergies  Meds:  Prescriptions prior to admission  Medication Sig Dispense Refill  . acetaminophen (TYLENOL) 500 MG tablet Take 500 mg by mouth as needed.       . Ferrous Sulfate (IRON) 28 MG TABS Take 1 tablet by mouth daily.      . metoprolol (LOPRESSOR) 12.5 mg TABS  tablet Take 0.5 tablets (12.5 mg total) by mouth 2 (two) times daily.  30 tablet  1  . Prenatal Vit-Fe Fumarate-FA (PRENATAL MULTIVITAMIN) TABS tablet Take 1 tablet by mouth daily at 12 noon.      . Prenatal Vit-Fe Fumarate-FA (PRENATAL PO) Take 1 capsule by mouth daily.        ROS: Pertinent findings in history of present illness.  Physical Exam  Blood pressure 115/73, pulse 91, temperature 98.1 F (36.7 C), temperature source Oral, resp. rate 18, height 5\' 6"  (1.676 m), weight 62.778 kg (138 lb 6.4 oz). GENERAL: Well-developed, well-nourished female in no acute distress.  HEENT: normocephalic HEART: normal rate RESP: normal effort ABDOMEN: Soft, non-tender, gravid appropriate for gestational age EXTREMITIES: Nontender, no edema NEURO: alert and oriented  Dilation: Fingertip Effacement (%): 90 Cervical Position: Posterior Station: -1 Presentation: Vertex Exam by:: L.Leftwich-Kirby,CNM  FHT:  Baseline 130 , moderate variability, accelerations present, no decelerations Contractions: q 2-3 minutes, moderate to palpation    Assessment: 1. Active labor at term   2. GBS negative  Plan: Consult Dr Ellyn Hack Admit to [facility] May have epidural as desired Stadol 2 mg IV Q1 hour PRN    Medication List    ASK your doctor about  these medications       acetaminophen 500 MG tablet  Commonly known as:  TYLENOL  Take 500 mg by mouth as needed.     Iron 28 MG Tabs  Take 1 tablet by mouth daily.     metoprolol tartrate 12.5 mg Tabs tablet  Commonly known as:  LOPRESSOR  Take 0.5 tablets (12.5 mg total) by mouth 2 (two) times daily.     prenatal multivitamin Tabs tablet  Take 1 tablet by mouth daily at 12 noon.     PRENATAL PO  Take 1 capsule by mouth daily.        Sharen CounterLisa Leftwich-Kirby Certified Nurse-Midwife 01/09/2014 2:51 AM

## 2014-01-09 NOTE — Progress Notes (Signed)
Patient ID: Kathy RoesKelly D Sherry, female   DOB: 1981-04-13, 33 y.o.   MRN: [id number]  Comfortable with epidural  AFVSS gen NAD FHTs 120's, category 1, good var Toco q 2-714min  SVE 1/90/0-1  Bloody show with SVE, stripped membranes, BBOW  Continue current mgmt

## 2014-01-09 NOTE — H&P (Signed)
Kathy [name] is a 33 y.o. female  G1P0 with regular painful ctx, cervix is FT, but effaced.  Relatively uncomplicated PNC.  Pt with h/o SVT - has h/o laser ablation, was on Lopressor per cards for tachycardia.  +FM, no LOF, no VB, ctx increasing in intensity and frequency.   Maternal Medical History:  Reason for admission: Contractions.   Contractions: Frequency: regular.    Fetal activity: Perceived fetal activity is normal.    Prenatal Complications - Diabetes: none.    OB History   Grav Para Term Preterm Abortions TAB SAB Ect Mult Living   1             G1 present Abn pap - cryo No STD  Past Medical History  Diagnosis Date  . Dysrhythmia     svt  . Abnormal Pap smear     cryo/ freezing, normal pap after   Past Surgical History  Procedure Laterality Date  . Cardiac electrophysiology mapping and ablation     Family History: hypercholesterolemia, HTN, tachycardia, COPD, Anxiety, DM, Cushings, Skin Ca Social History:  reports that she has been smoking Cigarettes.  She has been smoking about 0.50 packs per day. She does not have any smokeless tobacco history on file. She reports that she does not drink alcohol or use illicit drugs.single, CNA Meds PNV All NKDA, LATEX   Prenatal Transfer Tool  Maternal Diabetes: No Genetic Screening: Normal Maternal Ultrasounds/Referrals: Normal Fetal Ultrasounds or other Referrals:  None Maternal Substance Abuse:  No Significant Maternal Medications:  None Lopressor - d/c 1 wk ago Significant Maternal Lab Results:  Lab values include: Group B Strep negative Other Comments:  tachycardia, s/p ablation on Lopressor, CF neg  Review of Systems  Constitutional: Negative.   HENT: Negative.   Eyes: Negative.   Respiratory: Negative.   Cardiovascular: Negative.   Gastrointestinal: Negative.   Genitourinary: Negative.   Musculoskeletal: Negative.   Skin: Negative.   Neurological: Negative.   Psychiatric/Behavioral: Negative.      Dilation: Fingertip Effacement (%): 70 Station: -1 Exam by:: B.Cagna,RN Blood pressure 106/69, pulse 81, temperature 98.1 F (36.7 C), temperature source Oral, resp. rate 20, height 5\' 6"  (1.676 m), weight 62.778 kg (138 lb 6.4 oz). Maternal Exam:  Uterine Assessment: Contraction strength is moderate.  Contraction frequency is regular.   Abdomen: Fundal height is appropriate for gestation.   Estimated fetal weight is 7-8#.   Fetal presentation: vertex  Introitus: Normal vulva. Normal vagina.  Pelvis: adequate for delivery.   Cervix: Cervix evaluated by digital exam.     Physical Exam  Constitutional: She is oriented to person, place, and time. She appears well-developed and well-nourished.  HENT:  Head: Normocephalic and atraumatic.  Cardiovascular: Normal rate and regular rhythm.   Respiratory: Effort normal and breath sounds normal. No respiratory distress. She has no wheezes.  GI: Soft. Bowel sounds are normal. She exhibits no distension. There is no tenderness.  Musculoskeletal: Normal range of motion.  Neurological: She is alert and oriented to person, place, and time.  Skin: Skin is warm and dry.  Psychiatric: She has a normal mood and affect. Her behavior is normal.    Prenatal labs: ABO, Rh: O/Positive/-- (07/11 0000) Antibody: Negative (07/11 0000) Rubella: Immune (07/11 0000) RPR: Nonreactive (07/11 0000)  HBsAg: Negative (07/11 0000)  HIV: Non-reactive (07/11 0000)  GBS: Negative (01/10 0000)   Tdap/Flu 10/12/13   Hgb 13.7/Ur Cx neg/ GC neg/ Chl neg/ CF neg/First Tri Scr neg/AFP WNL/glucola 102  [place] nl anat, post plac, growth, female  Nl grtowth, followed by [place]  Assessment/Plan: 33yo G1 @ 39+ with ctx GBBS neg Continue close monitoring    BOVARD,Ladonya Jerkins 01/09/2014, 8:27 AM

## 2014-01-09 NOTE — Progress Notes (Signed)
Patient ID: Kathy [name], female   DOB: 10/12/1981, 33 y.o.   MRN: [id number]  Pt uncomfortable w/ ctx  AFVSS gen NAD FHTs 120's, R, category 1 toco q 2-3 min  SVE FT/90/-2  Pt w/ h/o cryo x 2 and laser therapy of cervix - seems to be in early labor - scar tissue at cervix

## 2014-01-09 NOTE — MAU Note (Signed)
Pt reports baby has not much tonight. Rots some increased pelvic pressure and some tightening on and off.

## 2014-01-10 MED ORDER — FENTANYL CITRATE 0.05 MG/ML IJ SOLN
INTRAMUSCULAR | Status: AC
  Filled 2014-01-10: qty 2

## 2014-01-10 MED ORDER — SODIUM CHLORIDE 0.9 % IV SOLN
2.0000 g | Freq: Four times a day (QID) | INTRAVENOUS | Status: DC
  Administered 2014-01-10 (×2): 2 g via INTRAVENOUS
  Filled 2014-01-10 (×4): qty 2000

## 2014-01-10 MED ORDER — BUPIVACAINE HCL (PF) 0.5 % IJ SOLN
INTRAMUSCULAR | Status: DC | PRN
  Administered 2014-01-10: 2.5 mL via EPIDURAL

## 2014-01-10 MED ORDER — FENTANYL CITRATE 0.05 MG/ML IJ SOLN
100.0000 ug | Freq: Once | INTRAMUSCULAR | Status: AC
Start: 2014-01-10 — End: 2014-01-10
  Administered 2014-01-10: 100 ug via EPIDURAL
  Filled 2014-01-10: qty 2

## 2014-01-10 MED ORDER — ACETAMINOPHEN 650 MG RE SUPP
650.0000 mg | Freq: Once | RECTAL | Status: AC
  Administered 2014-01-10: 650 mg via RECTAL
  Filled 2014-01-10: qty 1

## 2014-01-10 MED ORDER — GENTAMICIN SULFATE 40 MG/ML IJ SOLN
160.0000 mg | Freq: Three times a day (TID) | INTRAVENOUS | Status: DC
  Administered 2014-01-10 – 2014-01-11 (×2): 160 mg via INTRAVENOUS
  Filled 2014-01-10 (×4): qty 4

## 2014-01-10 MED ORDER — FENTANYL CITRATE 0.05 MG/ML IJ SOLN
100.0000 ug | Freq: Once | INTRAMUSCULAR | Status: AC
Start: 2014-01-10 — End: 2014-01-10
  Administered 2014-01-10: 100 ug via EPIDURAL

## 2014-01-10 MED ORDER — SODIUM CHLORIDE 0.9 % IV SOLN
12.5000 mg | Freq: Once | INTRAVENOUS | Status: DC
  Filled 2014-01-10: qty 0.5

## 2014-01-10 MED ORDER — ACETAMINOPHEN 40 MG HALF SUPP: 1000.0000 mg | Freq: Once | RECTAL | Status: DC

## 2014-01-10 MED ORDER — PROMETHAZINE HCL 25 MG/ML IJ SOLN
12.5000 mg | Freq: Once | INTRAMUSCULAR | Status: AC
  Administered 2014-01-10: 12.5 mg via INTRAVENOUS

## 2014-01-10 MED ORDER — CLINDAMYCIN PHOSPHATE 900 MG/50ML IV SOLN
900.0000 mg | Freq: Three times a day (TID) | INTRAVENOUS | Status: DC
  Administered 2014-01-10: 900 mg via INTRAVENOUS
  Filled 2014-01-10 (×3): qty 50

## 2014-01-10 MED ORDER — ACETAMINOPHEN 650 MG RE SUPP
975.0000 mg | Freq: Once | RECTAL | Status: DC
  Filled 2014-01-10: qty 2

## 2014-01-10 MED ORDER — BUPIVACAINE HCL (PF) 0.25 % IJ SOLN
INTRAMUSCULAR | Status: DC | PRN
  Administered 2014-01-10: 5 mL via EPIDURAL
  Administered 2014-01-10: 2.5 mL via EPIDURAL
  Administered 2014-01-10 (×3): 5 mL via EPIDURAL

## 2014-01-10 NOTE — Progress Notes (Signed)
Patient ID: Kathy [name], female   DOB: 1981/02/22, 33 y.o.   MRN: [id number]  Uncomfortable with pressure, getting PCA doses.  AF VSS gen NAD FHTs 145-150 mod var, category 1 toco Q 2-3 min  SVE 7/90/0-+1  Seems to be adequate, making slow steady change Continue current mgmt.

## 2014-01-10 NOTE — Progress Notes (Signed)
Patient ID: Kathy RoesKelly [name], female   DOB: [birth year], 33 y.o.   MRN: [id number]  Pt was feeling pressure, epidural redosed - now comfortable.  Pt had fever - treated with ampicillin, gentamicin and tylenol.  VSS gen NAD FHT 145-165 mod var, category 1 toco Q362min  SVE 7.5/100/0-+1  Pt struggling with lengthy labor.  Tearful.  Requesting end to labor, i.e. Cesarean delivery.   D/w pt at length r/b/a of LTCS - including but not limited to - bleeding, infection and damage to surrounding organs, injury to infant and trouble healing.  Pt's questions answered - wanted to discuss further with FOB.  Currently to continue current mgmt

## 2014-01-10 NOTE — Progress Notes (Signed)
ANTIBIOTIC CONSULT NOTE - INITIAL  Pharmacy Consult for Gentamicin Indication: R/O Chorioamnionitis  No Known Allergies  Patient Measurements: Height: 5\' 6"  (167.6 cm) Weight: 138 lb 6.4 oz (62.778 kg) IBW/kg (Calculated) : 59.3 Adjusted Body Weight: 62.8kg (Actual)  Vital Signs: Temp: 99.9 F (37.7 C) (01/11 1900) Temp src: Oral (01/11 1900) BP: 119/69 mmHg (01/11 1911) Pulse Rate: 102 (01/11 1911) Intake/Output from previous day: 01/10 0701 - 01/11 0700 In: -  Out: 850 [Urine:850] Intake/Output from this shift:    Labs:  Recent Labs  01/09/14 0300  WBC 12.3*  HGB 13.4  PLT 235   Estimated Creatinine Clearance: 95.4 ml/min (by C-G formula based on Cr of 0.41).   Microbiology: Recent Results (from the past 720 hour(s))  OB RESULTS CONSOLE GBS     Status: None   Collection Time    01/09/14 12:00 AM      Result Value Range Status   GBS Negative   Final    Medical History: Past Medical History  Diagnosis Date  . Dysrhythmia     svt  . Abnormal Pap smear     cryo/ freezing, normal pap after    Medications:  Ampicillin 2 gram IV q6h Assessment: 32yo F at 40+ weeks admitted in active labor. Pt has now developed maternal temp during labor. Ampicillin and Gentamicin initiated to r/o chorioamnionitis.  Goal of Therapy:  Gentamicin peak 6-598mcg/ml and trough < 441mcg/ml  Plan:  1. Gentamicin 160mg  IV q8h. 2. If continued > 72 hours, will draw SCreatinine to update renal function. 3. Will continue to follow and assess need for further kinetic workup. Thanks!  Claybon Jabsngel, Parminder Trapani G 01/10/2014,7:15 PM

## 2014-01-10 NOTE — Progress Notes (Signed)
Patient ID: Kathy RoesKelly D Laufer, female   DOB: 1981/12/17, 33 y.o.   MRN: [id number] Pt comfortable.  Getting frustrated with length of labor.  AFVSS gen NAD FHTs 120's, mod var, category 1 toco Q 2-773min  IUPC placed, without difficulty/complication 6/90/0  Reccommended sleep for several hours Will adjust pitocin prn  D/W pt possible need for LTCS - but not there yet!

## 2014-01-10 NOTE — Progress Notes (Signed)
Patient ID: Kathy [name], female   DOB: [birth year], 33 y.o.   MRN: [id number]  Comfortable with epidural  AFVSS gen NAD FHTs 120's, mod var, category 1 toco Q 2-463min  AROM for clear fluidm w/o diff/comp  5/80/-1  Pit @ 12mU  Cont current mgmt

## 2014-01-10 NOTE — Progress Notes (Signed)
Provider at bedside. Patient is requesting cesarean section delivery. Provider will recheck in an hour. Risks and benefits of cesarean section discussed. Patient states understanding.

## 2014-01-11 ENCOUNTER — Encounter (HOSPITAL_COMMUNITY): Payer: Self-pay | Admitting: Obstetrics and Gynecology

## 2014-01-11 LAB — CBC
HCT: 29.2 % — ABNORMAL LOW (ref 36.0–46.0)
Hemoglobin: 10.4 g/dL — ABNORMAL LOW (ref 12.0–15.0)
MCH: 31.8 pg (ref 26.0–34.0)
MCHC: 35.6 g/dL (ref 30.0–36.0)
MCV: 89.3 fL (ref 78.0–100.0)
PLATELETS: 164 10*3/uL (ref 150–400)
RBC: 3.27 MIL/uL — ABNORMAL LOW (ref 3.87–5.11)
RDW: 13.1 % (ref 11.5–15.5)
WBC: 23.5 10*3/uL — AB (ref 4.0–10.5)

## 2014-01-11 LAB — TYPE AND SCREEN
ABO/RH(D): O POS
Antibody Screen: NEGATIVE

## 2014-01-11 LAB — ABO/RH: ABO/RH(D): O POS

## 2014-01-11 MED ORDER — TETANUS-DIPHTH-ACELL PERTUSSIS 5-2.5-18.5 LF-MCG/0.5 IM SUSP
0.5000 mL | Freq: Once | INTRAMUSCULAR | Status: DC
Start: 2014-01-12 — End: 2014-01-13

## 2014-01-11 MED ORDER — SIMETHICONE 80 MG PO CHEW: 80.0000 mg | CHEWABLE_TABLET | ORAL | Status: DC | PRN

## 2014-01-11 MED ORDER — ONDANSETRON HCL 4 MG PO TABS: 4.0000 mg | ORAL_TABLET | ORAL | Status: DC | PRN

## 2014-01-11 MED ORDER — PRENATAL MULTIVITAMIN CH
1.0000 | ORAL_TABLET | Freq: Every day | ORAL | Status: DC
  Filled 2014-01-11: qty 1

## 2014-01-11 MED ORDER — WITCH HAZEL-GLYCERIN EX PADS: 1.0000 "application " | MEDICATED_PAD | CUTANEOUS | Status: DC | PRN

## 2014-01-11 MED ORDER — NESTABS ABC 32-1-200 MG PO MISC
2.0000 | Freq: Every day | ORAL | Status: DC
  Administered 2014-01-11: 2 via ORAL
  Administered 2014-01-12 – 2014-01-13 (×2): via ORAL

## 2014-01-11 MED ORDER — ZOLPIDEM TARTRATE 5 MG PO TABS: 5.0000 mg | ORAL_TABLET | Freq: Every evening | ORAL | Status: DC | PRN

## 2014-01-11 MED ORDER — SENNOSIDES-DOCUSATE SODIUM 8.6-50 MG PO TABS
2.0000 | ORAL_TABLET | ORAL | Status: DC
  Administered 2014-01-12 (×2): 2 via ORAL
  Filled 2014-01-11 (×2): qty 2

## 2014-01-11 MED ORDER — DIPHENHYDRAMINE HCL 25 MG PO CAPS: 25.0000 mg | ORAL_CAPSULE | Freq: Four times a day (QID) | ORAL | Status: DC | PRN

## 2014-01-11 MED ORDER — OXYCODONE-ACETAMINOPHEN 5-325 MG PO TABS
1.0000 | ORAL_TABLET | ORAL | Status: DC | PRN
  Administered 2014-01-11: 1 via ORAL
  Administered 2014-01-12 (×2): 2 via ORAL
  Administered 2014-01-12: 1 via ORAL
  Filled 2014-01-11: qty 2
  Filled 2014-01-11: qty 1
  Filled 2014-01-11: qty 2
  Filled 2014-01-11 (×2): qty 1

## 2014-01-11 MED ORDER — BENZOCAINE-MENTHOL 20-0.5 % EX AERO
1.0000 "application " | INHALATION_SPRAY | CUTANEOUS | Status: DC | PRN
  Administered 2014-01-11: 1 via TOPICAL
  Filled 2014-01-11 (×2): qty 56

## 2014-01-11 MED ORDER — LANOLIN HYDROUS EX OINT: TOPICAL_OINTMENT | CUTANEOUS | Status: DC | PRN

## 2014-01-11 MED ORDER — ONDANSETRON HCL 4 MG/2ML IJ SOLN: 4.0000 mg | INTRAMUSCULAR | Status: DC | PRN

## 2014-01-11 MED ORDER — LACTATED RINGERS IV SOLN: INTRAVENOUS | Status: DC

## 2014-01-11 MED ORDER — DIBUCAINE 1 % RE OINT: 1.0000 "application " | TOPICAL_OINTMENT | RECTAL | Status: DC | PRN

## 2014-01-11 MED ORDER — IBUPROFEN 600 MG PO TABS
600.0000 mg | ORAL_TABLET | Freq: Four times a day (QID) | ORAL | Status: DC
  Administered 2014-01-11 – 2014-01-13 (×9): 600 mg via ORAL
  Filled 2014-01-11 (×9): qty 1

## 2014-01-11 NOTE — Progress Notes (Signed)
Post Partum Day 0 Subjective: no complaints, up ad lib, voiding, tolerating PO and nl lochia, pain controlled.  baby in NICU, doing well.  RA, abx  Objective: Blood pressure 105/56, pulse 92, temperature 97.8 F (36.6 C), temperature source Oral, resp. rate 18, height 5\' 6"  (1.676 m), weight 62.778 kg (138 lb 6.4 oz), SpO2 98.00%, unknown if currently breastfeeding.  Physical Exam:  General: alert and no distress Lochia: appropriate Uterine Fundus: firm   Recent Labs  01/09/14 0300 01/11/14 0520  HGB 13.4 10.4*  HCT 37.3 29.2*    Assessment/Plan: Breastfeeding and Lactation consult.  Routine care.     LOS: 2 days   BOVARD,Kathy [name] 01/11/2014, 7:36 AM

## 2014-01-11 NOTE — Lactation Note (Signed)
This note was copied from the chart of Kathy Dalia HeadingKelly Coble. Lactation Consultation Note     Initial consult with this mom of a term NICU baby, now 8 hours post partum, and with a diagnosis of neonatal  depression and r/o sepsis.  The mom began pumping her breast, and was shown how to set the premie setting for the first 48 hours hour.Mom  Was able to express more than 4 mls of colostrum. I also advised mom to add hand expression with each pumping, which I showed her how to do. Mom return demonstrated with fair technique. Teaching done from the NICU booklet on EBM. Mom is active with WIC, and knows to call to add baby and for DEP. I will follow this family in the nICU, and mom knows to call fo lactation as needed.  Patient Name: Kathy Dalia HeadingKelly Benedict RUEAV'WToday's Date: 01/11/2014 Reason for consult: Initial assessment;NICU baby   Maternal Data Formula Feeding for Exclusion: Yes (Baby in NICU) Infant to breast within first hour of birth: No Breastfeeding delayed due to:: Infant status Has patient been taught Hand Expression?: Yes Does the patient have breastfeeding experience prior to this delivery?: No  Feeding    LATCH Score/Interventions                      Lactation Tools Discussed/Used Tools: Pump Breast pump type: Double-Electric Breast Pump WIC Program: Yes (mom knows to call to add baby, and for DEP) Pump Review: Setup, frequency, and cleaning;Milk Storage;Other (comment) (hand expression, teaching from NICU booklet) Initiated by:: bedside RN at 8 hours pot partum Date initiated:: 01/11/14   Consult Status Consult Status: Follow-up Date: 01/12/14 Follow-up type: In-patient    Alfred LevinsLee, Todrick Siedschlag Anne 01/11/2014, 10:43 AM

## 2014-01-11 NOTE — Progress Notes (Signed)
Pt's epidural site leaking scant amount of clear fluid.  There is no redness, tenderness of inflamation at site, applied 2x2 gauze, informed Dr. Ellyn HackBovard, she requested I let anesthesia know.  I called and informed dr. Jean RosenthalJackson.  He requested I apply CD dressing, and call if any bloody Discharge.   At 1130 dressing remains CD&I

## 2014-01-11 NOTE — Progress Notes (Signed)
Epidural site still leaking scant amount of fluid. No bloody drainage assessed. 2 x 2 gauze changed and covered with a tegaderm at this time.

## 2014-01-11 NOTE — Progress Notes (Signed)
Ur chart review completed.  

## 2014-01-11 NOTE — Anesthesia Postprocedure Evaluation (Signed)
Anesthesia Post Note  Patient: Kathy [name]  Procedure(s) Performed: * No procedures listed *  Anesthesia type: Epidural  Patient location: Mother/Baby  Post pain: Pain level controlled  Post assessment: Post-op Vital signs reviewed  Last Vitals:  Filed Vitals:   01/11/14 0830  BP: 102/79  Pulse: 68  Temp: 36.6 C  Resp: 18    Post vital signs: Reviewed  Level of consciousness:alert  Complications: No apparent anesthesia complications

## 2014-01-12 NOTE — Progress Notes (Signed)
Post Partum Day 1 Subjective: no complaints, up ad lib, voiding, tolerating PO and nl lochia, pain controlled.    Objective: Blood pressure 90/53, pulse 71, temperature 97.6 F (36.4 C), temperature source Oral, resp. rate 18, height 5\' 6"  (1.676 m), weight 62.778 kg (138 lb 6.4 oz), SpO2 98.00%, unknown if currently breastfeeding.  Physical Exam:  General: alert and no distress Lochia: appropriate Uterine Fundus: firm   Recent Labs  01/11/14 0520  HGB 10.4*  HCT 29.2*    Assessment/Plan: Plan for discharge tomorrow, Breastfeeding and Lactation consult.  Routine care.     LOS: 3 days   Kathy [name],Kathy [name] 01/12/2014, 7:58 AM

## 2014-01-12 NOTE — Lactation Note (Signed)
This note was copied from the chart of Kathy [name]. Lactation Consultation Note   Follow up consult with this mom of a NICU baby, now 40 hours post partum. Mom has been pumping every 3 hours, except she did sleep last night, which I suggested she do. She is expressing tiny drops of colostrum.  I told mom I would assist her with latching her baby tomrrow, at 11 am.  Patient Name: Kathy [name] [id number]'WToday's Date: 01/12/2014 Reason for consult: Follow-up assessment;NICU baby   Maternal Data    Feeding Feeding Type: Formula Length of feed: 20 min  LATCH Score/Interventions                      Lactation Tools Discussed/Used     Consult Status Consult Status: Follow-up Date: 01/13/14 Follow-up type: In-patient    Alfred LevinsLee, Kirbi Farrugia Anne 01/12/2014, 7:05 PM

## 2014-01-12 NOTE — Progress Notes (Signed)
CSW attempted again to meet with MOB to offer support and complete assessment for NICU admission.  MOB was getting ready to go to NICU for baby's feeding.  CSW briefly explained CSW's role and support services offered.  CSW gave contact information and asked MOB to call any time.  She was very appreciative of CSW's concern for her and states [facility].  She states no concerns, questions or needs at this time and states she will call CSW if they arise.  She again thanked CSW for coming to see her.

## 2014-01-12 NOTE — Progress Notes (Signed)
CSW attempted to meet with MOB to offer support and complete assessment due to NICU admission, but she was resting at this time.  CSW will attempt again at a later time. 

## 2014-01-13 MED ORDER — OXYCODONE-ACETAMINOPHEN 5-325 MG PO TABS: 1.0000 | ORAL_TABLET | Freq: Four times a day (QID) | ORAL | Status: DC | PRN

## 2014-01-13 MED ORDER — PRENATAL MULTIVITAMIN CH: 1.0000 | ORAL_TABLET | Freq: Every day | ORAL | Status: DC

## 2014-01-13 MED ORDER — IBUPROFEN 800 MG PO TABS: 800.0000 mg | ORAL_TABLET | Freq: Three times a day (TID) | ORAL | Status: DC | PRN

## 2014-01-13 NOTE — Lactation Note (Signed)
This note was copied from the chart of [name]. Lactation Consultation Note   Follow up consult with this mom of a NICU baby, full term, now 58 hours post partum. I assisted mom with  latching baby in cross cradle hold. He latched with assistance, but had a few soft suckles, but was mostly sleeping. He was ng tube fed while skin to skin with mom. Mom reports her breast feeding fuller and tender today, and with hand expression she has easily expressed transitional milk. Mom should be being discharged to home tomorrow. I will talk to her about renting a DEP.  Patient Name: [name] ZOXWR'UToday's Date: 01/13/2014 Reason for consult: Follow-up assessment;NICU baby   Maternal Data    Feeding Feeding Type: Breast Fed Length of feed: 15 min  LATCH Score/Interventions Latch: Too sleepy or reluctant, no latch achieved, no sucking elicited. (nipple in baby's mouth, but very sleepy and tachypneac) Intervention(s): Skin to skin  Audible Swallowing: None Intervention(s): Skin to skin;Hand expression  Type of Nipple: Everted at rest and after stimulation  Comfort (Breast/Nipple): Filling, red/small blisters or bruises, mild/mod discomfort  Problem noted: Filling  Hold (Positioning): Assistance needed to correctly position infant at breast and maintain latch. Intervention(s): Breastfeeding basics reviewed;Support Pillows;Position options;Skin to skin  LATCH Score: 4  Lactation Tools Discussed/Used     Consult Status Consult Status: Follow-up Date: 01/14/14 Follow-up type: In-patient    Alfred LevinsLee, Elham Fini Anne 01/13/2014, 11:46 AM

## 2014-01-13 NOTE — Progress Notes (Signed)
Post Partum Day 2 Subjective: no complaints, up ad lib, voiding, tolerating PO and nl lochia, pain controlled  Objective: Blood pressure 98/52, pulse 62, temperature 97.7 F (36.5 C), temperature source Oral, resp. rate 18, height 5\' 6"  (1.676 m), weight 62.778 kg (138 lb 6.4 oz), SpO2 100.00%, unknown if currently breastfeeding.  Physical Exam:  General: alert and no distress Lochia: appropriate Uterine Fundus: firm   Recent Labs  01/11/14 0520  HGB 10.4*  HCT 29.2*    Assessment/Plan: Discharge home, Breastfeeding and Lactation consult.  Routine care.  D/c with motrin, percocet and pnv.  F/u 6 weeks.     LOS: 4 days   BOVARD,Manraj Yeo 01/13/2014, 7:30 AM

## 2014-01-13 NOTE — Discharge Summary (Signed)
Obstetric Discharge Summary Reason for Admission: onset of labor Prenatal Procedures: none Intrapartum Procedures: spontaneous vaginal delivery Postpartum Procedures: none Complications-Operative and Postpartum: 2nd  degree perineal laceration and vaginal laceration Hemoglobin  Date Value Range Status  01/11/2014 10.4* 12.0 - 15.0 g/dL Final     HCT  Date Value Range Status  01/11/2014 29.2* 36.0 - 46.0 % Final    Physical Exam:  General: alert and no distress Lochia: appropriate Uterine Fundus: firm  Discharge Diagnoses: Term Pregnancy-delivered  Discharge Information: Date: 01/13/2014 Activity: pelvic rest Diet: routine Medications: PNV, Ibuprofen and Percocet Condition: stable Instructions: refer to practice specific booklet Discharge to: home Follow-up Information   Follow up with BOVARD,Trena Dunavan, MD. Schedule an appointment as soon as possible for a visit in 6 weeks.   Specialty:  Obstetrics and Gynecology   Contact information:   [address] [place] [address] [phone]       Newborn Data: Live born female  Birth Weight: 7 lb 1.2 oz (3209 g) APGAR: 1, 6  In NICU for abx  BOVARD,Kou Gucciardo 01/13/2014, 7:41 AM

## 2014-01-15 ENCOUNTER — Inpatient Hospital Stay (HOSPITAL_COMMUNITY): Admission: RE | Admit: 2014-01-15 | Payer: Medicaid Other | Source: Ambulatory Visit

## 2014-07-20 ENCOUNTER — Emergency Department (HOSPITAL_BASED_OUTPATIENT_CLINIC_OR_DEPARTMENT_OTHER): Payer: Medicaid Other

## 2014-07-20 ENCOUNTER — Emergency Department (HOSPITAL_BASED_OUTPATIENT_CLINIC_OR_DEPARTMENT_OTHER)
Admission: EM | Admit: 2014-07-20 | Discharge: 2014-07-20 | Disposition: A | Discharge status: Home / Self Care | Payer: Medicaid Other | Attending: Emergency Medicine | Admitting: Emergency Medicine

## 2014-07-20 ENCOUNTER — Encounter (HOSPITAL_BASED_OUTPATIENT_CLINIC_OR_DEPARTMENT_OTHER): Payer: Self-pay | Admitting: Emergency Medicine

## 2014-07-20 DIAGNOSIS — Z8679 Personal history of other diseases of the circulatory system: Secondary | ICD-10-CM | POA: Insufficient documentation

## 2014-07-20 DIAGNOSIS — M25579 Pain in unspecified ankle and joints of unspecified foot: Secondary | ICD-10-CM | POA: Insufficient documentation

## 2014-07-20 DIAGNOSIS — F17200 Nicotine dependence, unspecified, uncomplicated: Secondary | ICD-10-CM | POA: Insufficient documentation

## 2014-07-20 DIAGNOSIS — Z791 Long term (current) use of non-steroidal anti-inflammatories (NSAID): Secondary | ICD-10-CM | POA: Insufficient documentation

## 2014-07-20 DIAGNOSIS — M25572 Pain in left ankle and joints of left foot: Secondary | ICD-10-CM

## 2014-07-20 MED ORDER — OXYCODONE-ACETAMINOPHEN 5-325 MG PO TABS: 1.0000 | ORAL_TABLET | ORAL | Status: DC | PRN

## 2014-07-20 NOTE — ED Provider Notes (Signed)
CSN: [id number]     Arrival date & time 07/20/14  1104 History   First MD Initiated Contact with Patient 07/20/14 1131     Chief Complaint  Patient presents with  . Ankle Pain     (Consider location/radiation/quality/duration/timing/severity/associated sxs/prior Treatment) Patient is a 33 y.o. female presenting with ankle pain. The history is provided by the patient.  Ankle Pain Location:  Ankle Time since incident:  3 days Injury: no   Ankle location:  L ankle Pain details:    Quality:  Sharp   Radiates to:  Does not radiate   Severity:  Severe   Onset quality:  Gradual   Duration:  3 days   Timing:  Constant   Progression:  Worsening Chronicity:  New   Past Medical History  Diagnosis Date  . Dysrhythmia     svt  . Abnormal Pap smear     cryo/ freezing, normal pap after  . SVD (spontaneous vaginal delivery) 01/11/2014   Past Surgical History  Procedure Laterality Date  . Cardiac electrophysiology mapping and ablation     No family history on file. History  Substance Use Topics  . Smoking status: Current Every Day Smoker -- 0.50 packs/day    Types: Cigarettes  . Smokeless tobacco: Not on file  . Alcohol Use: No   OB History   Grav Para Term Preterm Abortions TAB SAB Ect Mult Living   1 1 1       1      Review of Systems  All other systems reviewed and are negative.     Allergies  Hydrocodone  Home Medications   Prior to Admission medications   Medication Sig Start Date End Date Taking? Authorizing Provider  diphenhydrAMINE (BENADRYL) 25 MG tablet Take 25 mg by mouth every 6 (six) hours as needed.    Historical Provider, MD  ibuprofen (ADVIL,MOTRIN) 800 MG tablet Take 1 tablet (800 mg total) by mouth every 8 (eight) hours as needed. 01/13/14   Sherian ReinJody Bovard-Stuckert, MD  oxyCODONE-acetaminophen (PERCOCET/ROXICET) 5-325 MG per tablet Take 1-2 tablets by mouth every 6 (six) hours as needed for severe pain (moderate - severe pain). 01/13/14   Sherian ReinJody  Bovard-Stuckert, MD  Prenatal Vit-Fe Fumarate-FA (PRENATAL MULTIVITAMIN) TABS tablet Take 1 tablet by mouth daily at 12 noon. 01/13/14   Jody Bovard-Stuckert, MD   BP 111/69  Pulse 104  Temp(Src) 98.1 F (36.7 C) (Oral)  Resp 16  SpO2 100%  LMP 07/06/2014 Physical Exam  Nursing note and vitals reviewed. Constitutional: She is oriented to person, place, and time. She appears well-developed and well-nourished. No distress.  HENT:  Head: Normocephalic and atraumatic.  Neck: Normal range of motion. Neck supple.  Musculoskeletal:  The left ankle appears grossly normal. There is no significant swelling or deformity. There is exquisite tenderness to palpation over the distal left fibula. The ankle joint appears stable in all planes.  Neurological: She is alert and oriented to person, place, and time.  Skin: Skin is warm and dry. She is not diaphoretic.    ED Course  Procedures (including critical care time) Labs Review Labs Reviewed - No data to display  Imaging Review No results found.   EKG Interpretation None      MDM   Final diagnoses:  None    X-rays are negative. I am uncertain as to the exact etiology of her discomfort. We'll treat with a small quantity of pain medication and an Ace bandage. If she is not improving in the next  week she is to followup with her primary Dr.    Geoffery Lyons, MD 07/20/14 1231

## 2014-07-20 NOTE — Discharge Instructions (Signed)
Percocet as prescribed as needed for pain.  Wear Ace bandages applied.  Followup with your primary Dr. if not improving in the next week.   Ankle Pain Ankle pain is a common symptom. The bones, cartilage, tendons, and muscles of the ankle joint perform a lot of work each day. The ankle joint holds your body weight and allows you to move around. Ankle pain can occur on either side or back of 1 or both ankles. Ankle pain may be sharp and burning or dull and aching. There may be tenderness, stiffness, redness, or warmth around the ankle. The pain occurs more often when a person walks or puts pressure on the ankle. CAUSES  There are many reasons ankle pain can develop. It is important to work with your caregiver to identify the cause since many conditions can impact the bones, cartilage, muscles, and tendons. Causes for ankle pain include:  Injury, including a break (fracture), sprain, or strain often due to a fall, sports, or a high-impact activity.  Swelling (inflammation) of a tendon (tendonitis).  Achilles tendon rupture.  Ankle instability after repeated sprains and strains.  Poor foot alignment.  Pressure on a nerve (tarsal tunnel syndrome).  Arthritis in the ankle or the lining of the ankle.  Crystal formation in the ankle (gout or pseudogout). DIAGNOSIS  A diagnosis is based on your medical history, your symptoms, results of your physical exam, and results of diagnostic tests. Diagnostic tests may include X-ray exams or a computerized magnetic scan (magnetic resonance imaging, MRI). TREATMENT  Treatment will depend on the cause of your ankle pain and may include:  Keeping pressure off the ankle and limiting activities.  Using crutches or other walking support (a cane or brace).  Using rest, ice, compression, and elevation.  Participating in physical therapy or home exercises.  Wearing shoe inserts or special shoes.  Losing weight.  Taking medications to reduce pain or  swelling or receiving an injection.  Undergoing surgery. HOME CARE INSTRUCTIONS   Only take over-the-counter or prescription medicines for pain, discomfort, or fever as directed by your caregiver.  Put ice on the injured area.  Put ice in a plastic bag.  Place a towel between your skin and the bag.  Leave the ice on for 15-20 minutes at a time, 03-04 times a day.  Keep your leg raised (elevated) when possible to lessen swelling.  Avoid activities that cause ankle pain.  Follow specific exercises as directed by your caregiver.  Record how often you have ankle pain, the location of the pain, and what it feels like. This information may be helpful to you and your caregiver.  Ask your caregiver about returning to work or sports and whether you should drive.  Follow up with your caregiver for further examination, therapy, or testing as directed. SEEK MEDICAL CARE IF:   Pain or swelling continues or worsens beyond 1 week.  You have an oral temperature above 102 F (38.9 C).  You are feeling unwell or have chills.  You are having an increasingly difficult time with walking.  You have loss of sensation or other new symptoms.  You have questions or concerns. MAKE SURE YOU:   Understand these instructions.  Will watch your condition.  Will get help right away if you are not doing well or get worse. Document Released: 06/06/2010 Document Revised: 03/10/2012 Document Reviewed: 06/06/2010 [facility] Patient Information 2015 [employer] [place]. This information is not intended to replace advice given to you by your health care provider.  Make sure you discuss any questions you have with your health care provider. ° °

## 2014-07-20 NOTE — ED Notes (Signed)
Left ankle pain that started Friday and worsening yesterday and today.  Denies injury and pain is worse with weight bearing.

## 2014-11-01 ENCOUNTER — Encounter (HOSPITAL_BASED_OUTPATIENT_CLINIC_OR_DEPARTMENT_OTHER): Payer: Self-pay | Admitting: Emergency Medicine

## 2014-12-20 ENCOUNTER — Emergency Department (HOSPITAL_BASED_OUTPATIENT_CLINIC_OR_DEPARTMENT_OTHER): Payer: Medicaid Other

## 2014-12-20 ENCOUNTER — Emergency Department (HOSPITAL_BASED_OUTPATIENT_CLINIC_OR_DEPARTMENT_OTHER)
Admission: EM | Admit: 2014-12-20 | Discharge: 2014-12-21 | Disposition: A | Discharge status: Home / Self Care | Payer: Medicaid Other | Attending: Emergency Medicine | Admitting: Emergency Medicine

## 2014-12-20 ENCOUNTER — Encounter (HOSPITAL_BASED_OUTPATIENT_CLINIC_OR_DEPARTMENT_OTHER): Payer: Self-pay | Admitting: *Deleted

## 2014-12-20 DIAGNOSIS — R0602 Shortness of breath: Secondary | ICD-10-CM | POA: Diagnosis present

## 2014-12-20 DIAGNOSIS — Z8679 Personal history of other diseases of the circulatory system: Secondary | ICD-10-CM | POA: Diagnosis not present

## 2014-12-20 DIAGNOSIS — Z720 Tobacco use: Secondary | ICD-10-CM | POA: Diagnosis not present

## 2014-12-20 DIAGNOSIS — Y998 Other external cause status: Secondary | ICD-10-CM | POA: Diagnosis not present

## 2014-12-20 DIAGNOSIS — Z79899 Other long term (current) drug therapy: Secondary | ICD-10-CM | POA: Insufficient documentation

## 2014-12-20 DIAGNOSIS — R0600 Dyspnea, unspecified: Secondary | ICD-10-CM

## 2014-12-20 DIAGNOSIS — S299XXA Unspecified injury of thorax, initial encounter: Secondary | ICD-10-CM | POA: Insufficient documentation

## 2014-12-20 DIAGNOSIS — T1490XA Injury, unspecified, initial encounter: Secondary | ICD-10-CM

## 2014-12-20 DIAGNOSIS — Y9389 Activity, other specified: Secondary | ICD-10-CM | POA: Insufficient documentation

## 2014-12-20 DIAGNOSIS — Y9289 Other specified places as the place of occurrence of the external cause: Secondary | ICD-10-CM | POA: Diagnosis not present

## 2014-12-20 DIAGNOSIS — W228XXA Striking against or struck by other objects, initial encounter: Secondary | ICD-10-CM | POA: Insufficient documentation

## 2014-12-20 MED ORDER — OXYCODONE-ACETAMINOPHEN 5-325 MG PO TABS: 1.0000 | ORAL_TABLET | Freq: Every evening | ORAL | Status: DC | PRN

## 2014-12-20 MED ORDER — IBUPROFEN 800 MG PO TABS: 800.0000 mg | ORAL_TABLET | Freq: Three times a day (TID) | ORAL | Status: DC

## 2014-12-20 MED ORDER — HYDROMORPHONE HCL 1 MG/ML IJ SOLN
1.0000 mg | Freq: Once | INTRAMUSCULAR | Status: AC
Start: 2014-12-20 — End: 2014-12-20
  Administered 2014-12-20: 1 mg via INTRAMUSCULAR
  Filled 2014-12-20: qty 1

## 2014-12-20 MED ORDER — DIPHENHYDRAMINE HCL 25 MG PO CAPS
50.0000 mg | ORAL_CAPSULE | Freq: Once | ORAL | Status: AC
  Administered 2014-12-20: 50 mg via ORAL
  Filled 2014-12-20: qty 2

## 2014-12-20 MED ORDER — KETOROLAC TROMETHAMINE 30 MG/ML IJ SOLN
30.0000 mg | Freq: Once | INTRAMUSCULAR | Status: AC
  Administered 2014-12-20: 30 mg via INTRAMUSCULAR
  Filled 2014-12-20: qty 1

## 2014-12-20 NOTE — Discharge Instructions (Signed)
Call for a follow up appointment with a Family or Primary Care Provider.  Return if Symptoms worsen.   Take medication as prescribed.  Continue to take deep breaths to decrease the chances of pneumonia. Ice your chest wall 3-4 times a day.
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
## 2015-07-19 IMAGING — CR DG CHEST 1V PORT
1 series · 1 of 1 positions shown · non-contrast
Comparison: 09/16/2007.

CLINICAL DATA: Pregnancy. Tachycardia. Elevated heart rate.

EXAM:
PORTABLE CHEST - 1 VIEW

[AP]
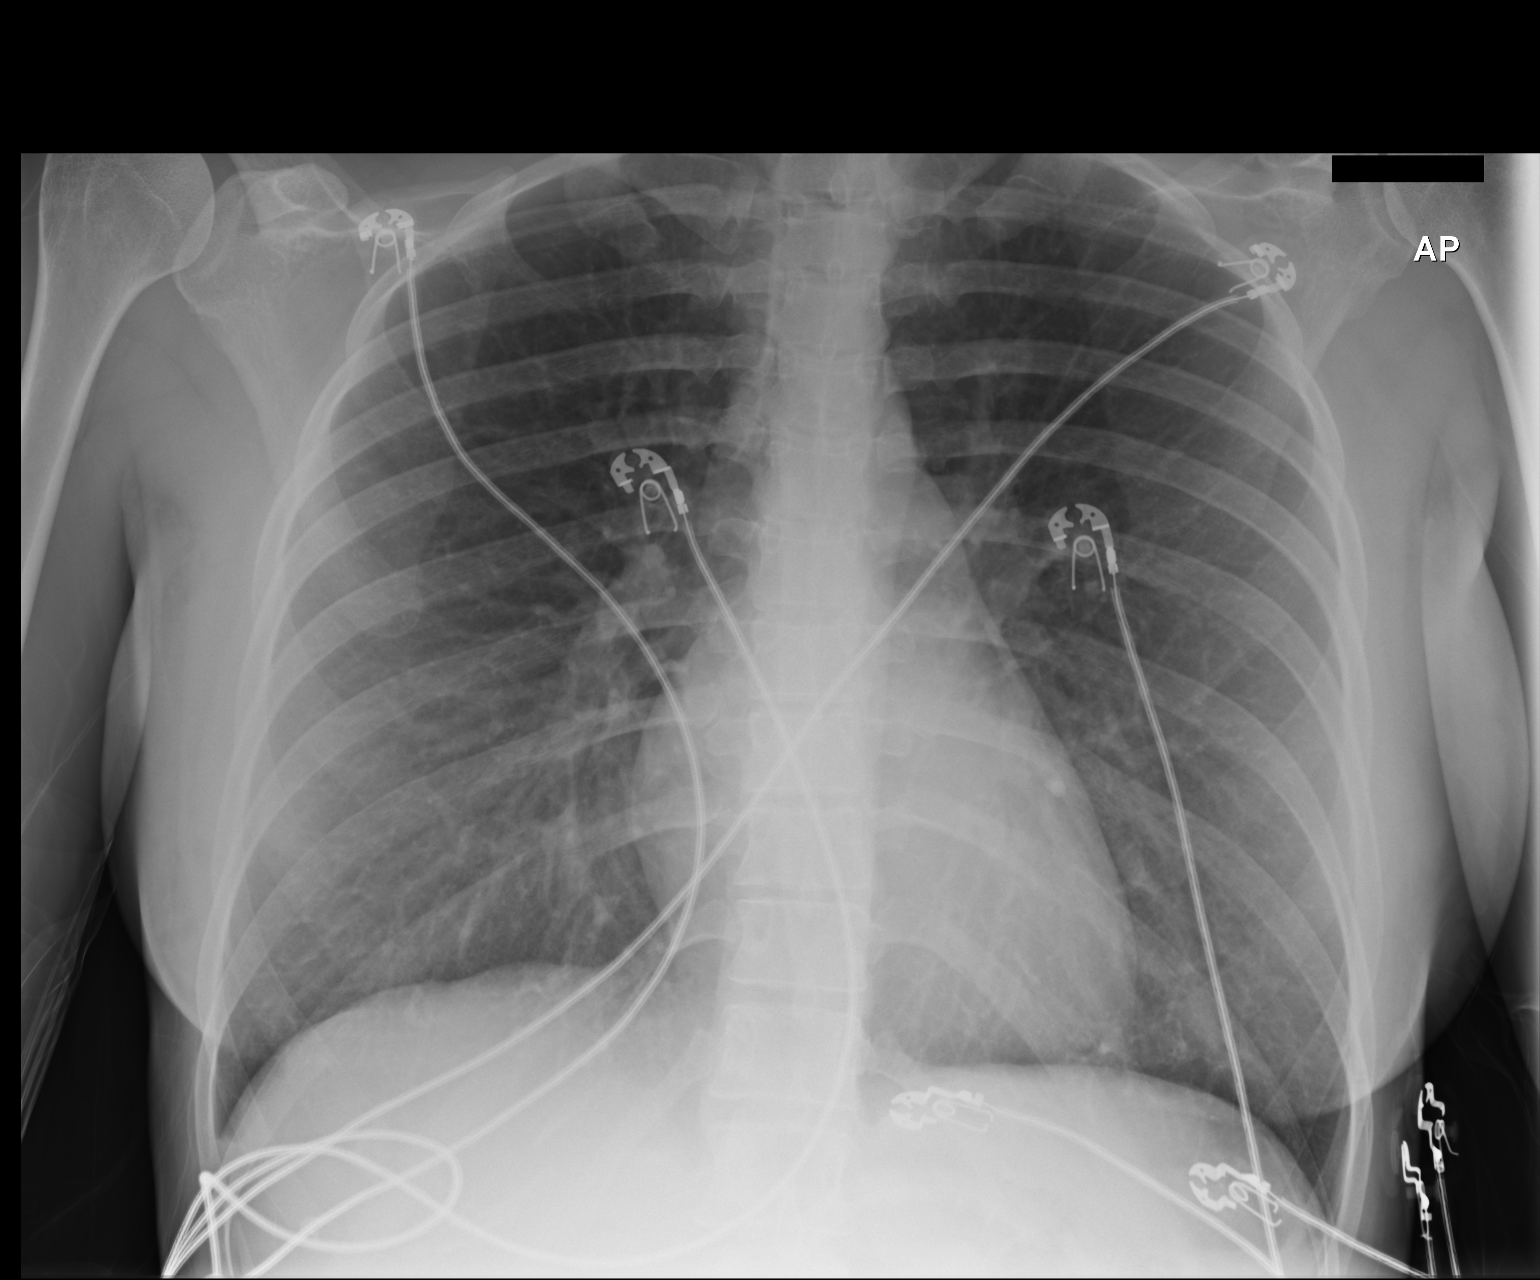

[1 of 1 positions shown; findings below may reference images not displayed]

FINDINGS: The heart size and mediastinal contours are within normal limits.
Both lungs are clear. The visualized skeletal structures are
unremarkable.
IMPRESSION: No active disease.

## 2015-07-29 ENCOUNTER — Emergency Department (HOSPITAL_COMMUNITY)
Admission: EM | Admit: 2015-07-29 | Discharge: 2015-07-29 | Disposition: A | Discharge status: Home / Self Care | Payer: Medicaid Other | Attending: Emergency Medicine | Admitting: Emergency Medicine

## 2015-07-29 ENCOUNTER — Emergency Department (HOSPITAL_COMMUNITY): Payer: Medicaid Other

## 2015-07-29 ENCOUNTER — Encounter (HOSPITAL_COMMUNITY): Payer: Self-pay | Admitting: Nurse Practitioner

## 2015-07-29 DIAGNOSIS — Z8679 Personal history of other diseases of the circulatory system: Secondary | ICD-10-CM | POA: Insufficient documentation

## 2015-07-29 DIAGNOSIS — Z720 Tobacco use: Secondary | ICD-10-CM | POA: Insufficient documentation

## 2015-07-29 DIAGNOSIS — F1210 Cannabis abuse, uncomplicated: Secondary | ICD-10-CM | POA: Insufficient documentation

## 2015-07-29 DIAGNOSIS — R4020 Unspecified coma: Secondary | ICD-10-CM

## 2015-07-29 DIAGNOSIS — R55 Syncope and collapse: Secondary | ICD-10-CM | POA: Insufficient documentation

## 2015-07-29 DIAGNOSIS — E860 Dehydration: Secondary | ICD-10-CM

## 2015-07-29 DIAGNOSIS — F1310 Sedative, hypnotic or anxiolytic abuse, uncomplicated: Secondary | ICD-10-CM | POA: Insufficient documentation

## 2015-07-29 DIAGNOSIS — F419 Anxiety disorder, unspecified: Secondary | ICD-10-CM | POA: Insufficient documentation

## 2015-07-29 DIAGNOSIS — R51 Headache: Secondary | ICD-10-CM | POA: Insufficient documentation

## 2015-07-29 DIAGNOSIS — Z79899 Other long term (current) drug therapy: Secondary | ICD-10-CM | POA: Insufficient documentation

## 2015-07-29 DIAGNOSIS — Z791 Long term (current) use of non-steroidal anti-inflammatories (NSAID): Secondary | ICD-10-CM | POA: Insufficient documentation

## 2015-07-29 DIAGNOSIS — R002 Palpitations: Secondary | ICD-10-CM | POA: Insufficient documentation

## 2015-07-29 DIAGNOSIS — R519 Headache, unspecified: Secondary | ICD-10-CM

## 2015-07-29 DIAGNOSIS — Z87898 Personal history of other specified conditions: Secondary | ICD-10-CM

## 2015-07-29 LAB — APTT: aPTT: 23 seconds — ABNORMAL LOW (ref 24–37)

## 2015-07-29 LAB — RAPID URINE DRUG SCREEN, HOSP PERFORMED
AMPHETAMINES: NOT DETECTED
Barbiturates: NOT DETECTED
Benzodiazepines: POSITIVE — AB
COCAINE: NOT DETECTED
OPIATES: NOT DETECTED
Tetrahydrocannabinol: POSITIVE — AB

## 2015-07-29 LAB — CBC WITH DIFFERENTIAL/PLATELET
BASOS PCT: 0 % (ref 0–1)
Basophils Absolute: 0 10*3/uL (ref 0.0–0.1)
EOS PCT: 1 % (ref 0–5)
Eosinophils Absolute: 0.1 10*3/uL (ref 0.0–0.7)
HEMATOCRIT: 40.6 % (ref 36.0–46.0)
Hemoglobin: 13.8 g/dL (ref 12.0–15.0)
LYMPHS PCT: 27 % (ref 12–46)
Lymphs Abs: 2.2 10*3/uL (ref 0.7–4.0)
MCH: 29.8 pg (ref 26.0–34.0)
MCHC: 34 g/dL (ref 30.0–36.0)
MCV: 87.7 fL (ref 78.0–100.0)
MONO ABS: 0.4 10*3/uL (ref 0.1–1.0)
Monocytes Relative: 4 % (ref 3–12)
Neutro Abs: 5.5 10*3/uL (ref 1.7–7.7)
Neutrophils Relative %: 68 % (ref 43–77)
Platelets: 220 10*3/uL (ref 150–400)
RBC: 4.63 MIL/uL (ref 3.87–5.11)
RDW: 13.5 % (ref 11.5–15.5)
WBC: 8.1 10*3/uL (ref 4.0–10.5)

## 2015-07-29 LAB — COMPREHENSIVE METABOLIC PANEL
ALBUMIN: 4.3 g/dL (ref 3.5–5.0)
ALT: 13 U/L — ABNORMAL LOW (ref 14–54)
AST: 17 U/L (ref 15–41)
Alkaline Phosphatase: 59 U/L (ref 38–126)
Anion gap: 7 (ref 5–15)
BUN: 8 mg/dL (ref 6–20)
CALCIUM: 8.6 mg/dL — AB (ref 8.9–10.3)
CO2: 24 mmol/L (ref 22–32)
CREATININE: 0.76 mg/dL (ref 0.44–1.00)
Chloride: 110 mmol/L (ref 101–111)
GFR calc Af Amer: >60 mL/min (ref >60)
GFR calc non Af Amer: >60 mL/min (ref >60)
Glucose, Bld: 87 mg/dL (ref 65–99)
POTASSIUM: 3.7 mmol/L (ref 3.5–5.1)
SODIUM: 141 mmol/L (ref 135–145)
TOTAL PROTEIN: 6.9 g/dL (ref 6.5–8.1)
Total Bilirubin: 0.9 mg/dL (ref 0.3–1.2)

## 2015-07-29 LAB — URINALYSIS, ROUTINE W REFLEX MICROSCOPIC
Bilirubin Urine: NEGATIVE
Glucose, UA: NEGATIVE mg/dL
KETONES UR: NEGATIVE mg/dL
Leukocytes, UA: NEGATIVE
Nitrite: NEGATIVE
Protein, ur: NEGATIVE mg/dL
Specific Gravity, Urine: 1.012 (ref 1.005–1.030)
Urobilinogen, UA: 0.2 mg/dL (ref 0.0–1.0)
pH: 5.5 (ref 5.0–8.0)

## 2015-07-29 LAB — PROTIME-INR
INR: 1.05 (ref 0.00–1.49)
PROTHROMBIN TIME: 13.9 s (ref 11.6–15.2)

## 2015-07-29 LAB — I-STAT TROPONIN, ED: TROPONIN I, POC: 0.01 ng/mL (ref 0.00–0.08)

## 2015-07-29 LAB — I-STAT BETA HCG BLOOD, ED (MC, WL, AP ONLY): I-stat hCG, quantitative: <5 m[IU]/mL (ref <5)

## 2015-07-29 LAB — CBG MONITORING, ED: Glucose-Capillary: 77 mg/dL (ref 65–99)

## 2015-07-29 LAB — URINE MICROSCOPIC-ADD ON

## 2015-07-29 LAB — ETHANOL

## 2015-07-29 LAB — TSH: TSH: 1.118 u[IU]/mL (ref 0.350–4.500)

## 2015-07-29 MED ORDER — SODIUM CHLORIDE 0.9 % IV BOLUS (SEPSIS)
500.0000 mL | Freq: Once | INTRAVENOUS | Status: AC
  Administered 2015-07-29: 500 mL via INTRAVENOUS

## 2015-07-29 MED ORDER — DIPHENHYDRAMINE HCL 50 MG/ML IJ SOLN
25.0000 mg | Freq: Once | INTRAMUSCULAR | Status: AC
  Administered 2015-07-29: 25 mg via INTRAVENOUS
  Filled 2015-07-29: qty 1

## 2015-07-29 MED ORDER — LORAZEPAM 2 MG/ML IJ SOLN
1.0000 mg | INTRAMUSCULAR | Status: DC | PRN
  Administered 2015-07-29: 1 mg via INTRAVENOUS
  Filled 2015-07-29: qty 1

## 2015-07-29 MED ORDER — KETOROLAC TROMETHAMINE 30 MG/ML IJ SOLN
30.0000 mg | Freq: Once | INTRAMUSCULAR | Status: AC
  Administered 2015-07-29: 30 mg via INTRAVENOUS
  Filled 2015-07-29: qty 1

## 2015-07-29 MED ORDER — METOCLOPRAMIDE HCL 5 MG/ML IJ SOLN
10.0000 mg | Freq: Once | INTRAMUSCULAR | Status: AC
  Administered 2015-07-29: 10 mg via INTRAVENOUS
  Filled 2015-07-29: qty 2

## 2015-07-29 MED ORDER — SODIUM CHLORIDE 0.9 % IV BOLUS (SEPSIS)
1000.0000 mL | Freq: Once | INTRAVENOUS | Status: AC
  Administered 2015-07-29: 1000 mL via INTRAVENOUS

## 2015-07-29 NOTE — ED Notes (Signed)
Patient transported to CT 

## 2015-07-29 NOTE — Discharge Instructions (Signed)
Dehydration, Adult °Dehydration means your body does not have as much fluid as it needs. Your kidneys, brain, and heart will not work properly without the right amount of fluids and salt.  °HOME CARE °· Ask your doctor how to replace body fluid losses (rehydrate). °· Drink enough fluids to keep your pee (urine) clear or pale yellow. °· Drink small amounts of fluids often if you feel sick to your stomach (nauseous) or throw up (vomit). °· Eat like you normally do. °· Avoid: °· Foods or drinks high in sugar. °· Bubbly (carbonated) drinks. °· Juice. °· Very hot or cold fluids. °· Drinks with caffeine. °· Fatty, greasy foods. °· Alcohol. °· Tobacco. °· Eating too much. °· Gelatin desserts. °· Wash your hands to avoid spreading germs (bacteria, viruses). °· Only take medicine as told by your doctor. °· Keep all doctor visits as told. °GET HELP RIGHT AWAY IF:  °· You cannot drink something without throwing up. °· You get worse even with treatment. °· Your vomit has blood in it or looks greenish. °· Your poop (stool) has blood in it or looks black and tarry. °· You have not peed in 6 to 8 hours. °· You pee a small amount of very dark pee. °· You have a fever. °· You pass out (faint). °· You have belly (abdominal) pain that gets worse or stays in one spot (localizes). °· You have a rash, stiff neck, or bad headache. °· You get easily annoyed, sleepy, or are hard to wake up. °· You feel weak, dizzy, or very thirsty. °MAKE SURE YOU:  °· Understand these instructions. °· Will watch your condition. °· Will get help right away if you are not doing well or get worse. °Document Released: 10/13/2009 Document Revised: 03/10/2012 Document Reviewed: 08/06/2011 °ExitCare® Patient Information ©2015 [employer]. This information is not intended to replace advice given to you by your health care provider. Make sure you discuss any questions you have with your health care provider. ° °Syncope °Syncope is a medical term for fainting or  passing out. This means you lose consciousness and drop to the ground. People are generally unconscious for less than 5 minutes. You may have some muscle twitches for up to 15 seconds before waking up and returning to normal. Syncope occurs more often in older adults, but it can happen to anyone. While most causes of syncope are not dangerous, syncope can be a sign of a serious medical problem. It is important to seek medical care.  °CAUSES  °Syncope is caused by a sudden drop in blood flow to the brain. The specific cause is often not determined. Factors that can bring on syncope include: °· Taking medicines that lower blood pressure. °· Sudden changes in posture, such as standing up quickly. °· Taking more medicine than prescribed. °· Standing in one place for too long. °· Seizure disorders. °· Dehydration and excessive exposure to heat. °· Low blood sugar (hypoglycemia). °· Straining to have a bowel movement. °· Heart disease, irregular heartbeat, or other circulatory problems. °· Fear, emotional distress, seeing blood, or severe pain. °SYMPTOMS  °Right before fainting, you may: °· Feel dizzy or light-headed. °· Feel nauseous. °· See all white or all black in your field of vision. °· Have cold, clammy skin. °DIAGNOSIS  °Your health care provider will ask about your symptoms, perform a physical exam, and perform an electrocardiogram (ECG) to record the electrical activity of your heart. Your health care provider may also perform other heart or   blood tests to determine the cause of your syncope which may include: °· Transthoracic echocardiogram (TTE). During echocardiography, sound waves are used to evaluate how blood flows through your heart. °· Transesophageal echocardiogram (TEE). °· Cardiac monitoring. This allows your health care provider to monitor your heart rate and rhythm in real time. °· Holter monitor. This is a portable device that records your heartbeat and can help diagnose heart arrhythmias. It  allows your health care provider to track your heart activity for several days, if needed. °· Stress tests by exercise or by giving medicine that makes the heart beat faster. °TREATMENT  °In most cases, no treatment is needed. Depending on the cause of your syncope, your health care provider may recommend changing or stopping some of your medicines. °HOME CARE INSTRUCTIONS °· Have someone stay with you until you feel stable. °· Do not drive, use machinery, or play sports until your health care provider says it is okay. °· Keep all follow-up appointments as directed by your health care provider. °· Lie down right away if you start feeling like you might faint. Breathe deeply and steadily. Wait until all the symptoms have passed. °· Drink enough fluids to keep your urine clear or pale yellow. °· If you are taking blood pressure or heart medicine, get up slowly and take several minutes to sit and then stand. This can reduce dizziness. °SEEK IMMEDIATE MEDICAL CARE IF:  °· You have a severe headache. °· You have unusual pain in the chest, abdomen, or back. °· You are bleeding from your mouth or rectum, or you have black or tarry stool. °· You have an irregular or very fast heartbeat. °· You have pain with breathing. °· You have repeated fainting or seizure-like jerking during an episode. °· You faint when sitting or lying down. °· You have confusion. °· You have trouble walking. °· You have severe weakness. °· You have vision problems. °If you fainted, call your local emergency services (911 in [place] Do not drive yourself to the hospital.  °MAKE SURE YOU: °· Understand these instructions. °· Will watch your condition. °· Will get help right away if you are not doing well or get worse. °Document Released: 12/17/2005 Document Revised: 12/22/2013 Document Reviewed: 02/15/2012 °ExitCare® Patient Information ©2015 [employer]. This information is not intended to replace advice given to you by your health care provider.  Make sure you discuss any questions you have with your health care provider. ° °Emergency Department Resource Guide °1) Find a Doctor and Pay Out of Pocket °Although you won't have to find out who is covered by your insurance plan, it is a good idea to ask around and get recommendations. You will then need to call the office and see if the doctor you have chosen will accept you as a new patient and what types of options they offer for patients who are self-pay. Some doctors offer discounts or will set up payment plans for their patients who do not have insurance, but you will need to ask so you aren't surprised when you get to your appointment. ° °2) Contact Your Local Health Department °Not all health departments have doctors that can see patients for sick visits, but many do, so it is worth a call to see if yours does. If you don't know where your local health department is, you can check in your phone book. The CDC also has a tool to help you locate your state's health department, and many state websites also have listings   of all of their local health departments. ° °3) Find a Walk-in Clinic °If your illness is not likely to be very severe or complicated, you may want to try a walk in clinic. These are popping up all over the country in pharmacies, drugstores, and shopping centers. They're usually staffed by nurse practitioners or physician assistants that have been trained to treat common illnesses and complaints. They're usually fairly quick and inexpensive. However, if you have serious medical issues or chronic medical problems, these are probably not your best option. ° °No Primary Care Doctor: °- Call [facility] at  832-8000 - they can help you locate a primary care doctor that  accepts your insurance, provides certain services, etc. °- Physician Referral Service- 1-800-533-3463 ° °Chronic Pain Problems: °Organization         Address  Phone   Notes  °[facility]  (336) 297-2271  Patients need to be referred by their primary care doctor.  ° °Medication Assistance: °Organization         Address  Phone   Notes  °[place] [facility] [address] °[place], [place] [address] (336) 641-8030 --Must be a resident of [place] °-- Must have NO insurance coverage whatsoever (no Medicaid/ Medicare, etc.) °-- The pt. MUST have a primary care doctor that directs their care regularly and follows them in the community °  °[facility]  (866) 331-1348   °[employer]  (888) 892-1162   ° °Agencies that provide inexpensive medical care: °Organization         Address  Phone   Notes  °[facility]  (336) 832-8035   °[facility]    (336) 832-7272   °[facility] [address] °[place], [place] [address] (336) 832-4777   °[facility] [address], °[place] (336) 271-4999   °[facility]    (336) 373-0678   °[facility]    (336) 272-1050   °[facility] ° [address], [place] Phone:  (336) 832-4444, Fax:  (336) 832-4440 Hours of Operation:  9 am - 6 pm, M-F.  Also accepts Medicaid/Medicare and self-pay.  °[facility] ° [address], [place] Phone: (336) [phone], Fax: (336) [phone]. Hours of Operation:  8:30 am - 5:30 pm, M-F.  Also accepts Medicaid and self-pay.  °[facility] [place] [address], [place] Phone: (336) 878-6027   °[facility] [address], [place], [place] (336)723-1848, Ext. 123 Mondays & Thursdays: 7-9 AM.  First 15 patients are seen on a first come, first serve basis. °  ° °Medicaid-accepting [place] Providers: ° °Organization         Address  Phone   Notes  °[facility] [address], [place] (336) 641-2100 Also accepts self-pay patients.  °[facility] [address], [place] ° (336)  856-9996   °[facility] [address], [place] (336) 288-8857   °[facility] [address] [place] [address], [place] (336) 299-7000   °Veita Bland [address], [place]  ° (336) 373-1557 Only accepts [place] Access Medicaid patients after they have their name applied to their card.  ° °Self-Pay (no insurance) in [place]: ° °Organization         Address  Phone   Notes  °  Sickle Cell Patients, [facility] [address], [place] (336) 832-1970   °[facility] [address], [place] (336) 832-4400   °[facility] ° [address], [place] (336) 992-4800   °[facility]/Dr. Osei-Bonsu ° [address] [place] [address], [place] or [address], [place] (336) [phone] Phone number for both [place] and [place] locations is the same.  °[facility] [address], [place] (336) 299-0000   °[facility] [address] [place] [address], [place] or [address] (336) 852-7530 °(336) 878-2260   °[facility] [address], [place] (336) 350-1642, phone; (336) 294-5005, fax Sees patients 1st and 3rd Saturday of every month.  Must not qualify for public or private insurance (i.e. Medicaid, Medicare, [place] Health Choice, Veterans' Benefits) • Household income should be no more than 200% of the poverty level •The clinic cannot treat you if you are pregnant or think you are pregnant • Sexually transmitted diseases are not treated at the clinic.  ° ° °Dental Care: °Organization         Address  Phone  Notes  °[facility] [address], [place] (336) 641-6152 Accepts children up to age 21 who are enrolled in Medicaid or [place] Health Choice; pregnant women with a Medicaid card; and children who have applied for Medicaid or [place] Health Choice, but were  declined, whose parents can pay a reduced fee at time of service.  °[facility] [place]  [address], [place] (336) 641-7733 Accepts children up to age 21 who are enrolled in Medicaid or [place] Health Choice; pregnant women with a Medicaid card; and children who have applied for Medicaid or [place] Health Choice, but were declined, whose parents can pay a reduced fee at time of service.  °[facility] ° [address], [place] (336) 641-4533 Patients are seen by appointment only. Walk-ins are not accepted. [facility] will see patients 18 years of age and older. °Monday - Tuesday (8am-5pm) °Most Wednesdays (8:30-5pm) °$30 per visit, cash only  °[facility] ° [address], [place] (336) 641-4533 Patients are seen by appointment only. Walk-ins are not accepted. [facility] will see patients 18 years of age and older. °One Wednesday Evening (Monthly: Volunteer Based).  $30 per visit, cash only  °[facility]  (919) 537-3737 for adults; Children under age 4, call Graduate Pediatric Dentistry at ([phone]) [phone]. Children aged 4-14, please call ([phone]) [phone] to request a pediatric application. ° Dental services are provided in all areas of dental care including fillings, crowns and bridges, complete and partial dentures, implants, gum treatment, root canals, and extractions. Preventive care is also provided. Treatment is provided to both adults and children. °Patients are selected via a lottery and there is often a waiting list. °  °[facility] [address], °[place] ° (336) 763-8833 [email or web address] °  °[facility] [address], [place], [place] (336)723-1848, Ext. 123 Second and Fourth Thursday of each month, opens at 6:30 AM; Clinic ends at 9 AM.  Patients are seen on a first-come first-served basis, and a limited number are seen during each clinic.   ° °[facility] ° [address], [place], [place] (336) 723-7904   Eligibility   Requirements °You must have lived in [place], [place], or [place] counties for at least the last three months. °  You cannot be eligible for state or federal sponsored healthcare insurance, including Veterans Administration, Medicaid, or Medicare. °  You generally cannot be eligible for healthcare insurance through your employer.  °  How to apply: °Eligibility screenings are held every Tuesday and Wednesday afternoon from 1:00 pm until 4:00 pm. You do not need an appointment for the interview!  °[facility] [address], [place], [place] 336-631-2330   °[facility]  336-342-8273   °[facility]  336-703-3100   °[facility]  336-570-6415   ° °Behavioral Health Resources in the Community: °Intensive Outpatient Programs °Organization         Address  Phone  Notes  °[facility] [address], [place], [place] 336-878-6098   °[facility] [address], [place], [place] 336-832-9800   °[facility] [address], [place], [place] ° 336-882-2125   °[facility] [address],  °[place], [place] 1-800-853-5163 or 336-641-4981   °Substance Abuse Resources °Organization         Address  Phone  Notes  °[facility]  336-882-2125   °[facility]  336-784-9470   °[facility]  336-285-9073   °[facility]  336-845-3988   °Residential & Outpatient Substance Abuse Program  1-800-659-3381   °Psychological Services °Organization         Address  Phone  Notes  °[facility]  336- 832-9600   °[facility]  336- 378-7881   °[facility] [address], [place] 1-800-853-5163 or 336-641-4981   ° °Mobile Crisis Teams °Organization         Address  Phone  Notes  °[facility] Mobile Crisis Care  Unit  1-877-626-1772   °Assertive °[facility] ° [address] [place], [place] 336-834-9664   °Sharon DeEsch [address] °[place] 336-554-5454   ° °Self-Help/Support Groups °Organization         Address  Phone             Notes  °[facility] - variety of support groups  336- 373-1402 Call for more information  °Narcotics Anonymous (NA), [facility] [address], °[place]  2 meetings at this location  ° °Residential Treatment Programs °Organization         Address  Phone  Notes  °[facility] [address],    °[place]  1-866-801-8205   °[facility] ° [address], [place], [place] 704-293-8524   °[facility] [address], [place] 336-845-3988 Admissions: 8am-3pm M-F  °[facility] [address]    °[place], [place] 336-841-1104   °[facility] [address], [place], [place] 336-379-7146   °[facility] [address]  °[place], [place] 336-285-9073   °[facility] Intensive Outpatient [address], [place], [place] 336-852-3033   °[facility] [address]  °[place], [place] 1-877-615-2722 or 336-784-9470   °[facility] [address] [place], [place] 336-227-7417 Accepts Medicaid  °[facility] [address]  °  1-800-659-3381 Substance Abuse/Addiction Treatment  ° °[place] Behavioral Health Resources °Organization           Address  Phone  Notes  °[facility]  (888) 581-9988   °Julie Brannon, PhD [address] [place], [place]   (336) 349-5553 or (336) 951-0000   °[facility]   [address] °[place], [place] (336) 349-4454   °[facility] [address], [place], [place] (336) 342-8316 Insurance/Medicaid/sponsorship through [facility]  °[facility] [address]                                     [place], [place] (336) 342-8316 Therapy/tele-psych/case  °[facility] [address]  ° [place], [place] (336) 349-2233    °Dr. Arfeen  (336) 349-4544   °[facility] [place]  [employer] [place] [facility] 1) [address], [place] °2) [address], [place] °3)  [address], [place] (336) 349-3220 °(336) 342-7768 ° °(336) 342-8140   °[place] [facility] (336) 342-1394 or (336) 342-3537 (After Hours)    ° ° ° °

## 2015-07-29 NOTE — ED Notes (Signed)
Pt is presented from home, medics report that the initial call was for stress, anxiety and lack of appetite. Pt however reports she had a near syncope episode which she describes as intermittent "blacking out." All these she says was secondary to a day long sitting on the pouch arguing with her boyfriend. She endorses hx of anxiety, states she has stopped taking he medications except for clonazepam. AOx4, reports fatigue and is denying any pain.

## 2015-07-29 NOTE — ED Notes (Signed)
Nurse drawing labs. 
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
## 2015-07-29 NOTE — ED Notes (Signed)
Bed: ZO10 Expected date:  Expected time:  Means of arrival:  Comments: 34 y/o stress/dehydration

## 2015-07-30 LAB — PROLACTIN: PROLACTIN: 13.2 ng/mL (ref 4.8–23.3)

## 2016-08-24 LAB — OB RESULTS CONSOLE ABO/RH: RH Type: POSITIVE

## 2016-08-24 LAB — OB RESULTS CONSOLE GC/CHLAMYDIA
CHLAMYDIA, DNA PROBE: NEGATIVE
Gonorrhea: NEGATIVE

## 2016-08-24 LAB — OB RESULTS CONSOLE ANTIBODY SCREEN: Antibody Screen: NEGATIVE

## 2016-08-24 LAB — OB RESULTS CONSOLE RPR: RPR: NONREACTIVE

## 2016-08-24 LAB — OB RESULTS CONSOLE HEPATITIS B SURFACE ANTIGEN: Hepatitis B Surface Ag: NEGATIVE

## 2016-08-24 LAB — OB RESULTS CONSOLE HIV ANTIBODY (ROUTINE TESTING): HIV: NONREACTIVE

## 2016-08-24 LAB — OB RESULTS CONSOLE RUBELLA ANTIBODY, IGM: Rubella: IMMUNE

## 2016-10-06 ENCOUNTER — Encounter (HOSPITAL_COMMUNITY): Payer: Self-pay | Admitting: *Deleted

## 2016-10-06 ENCOUNTER — Inpatient Hospital Stay (HOSPITAL_COMMUNITY)
Admission: AD | Admit: 2016-10-06 | Discharge: 2016-10-06 | Disposition: A | Discharge status: Home / Self Care | Payer: Medicaid Other | Source: Ambulatory Visit | Attending: Obstetrics and Gynecology | Admitting: Obstetrics and Gynecology

## 2016-10-06 DIAGNOSIS — R1032 Left lower quadrant pain: Secondary | ICD-10-CM | POA: Diagnosis not present

## 2016-10-06 DIAGNOSIS — O2342 Unspecified infection of urinary tract in pregnancy, second trimester: Secondary | ICD-10-CM | POA: Insufficient documentation

## 2016-10-06 DIAGNOSIS — Z3A14 14 weeks gestation of pregnancy: Secondary | ICD-10-CM | POA: Diagnosis not present

## 2016-10-06 DIAGNOSIS — N3000 Acute cystitis without hematuria: Secondary | ICD-10-CM

## 2016-10-06 DIAGNOSIS — O99332 Smoking (tobacco) complicating pregnancy, second trimester: Secondary | ICD-10-CM | POA: Diagnosis not present

## 2016-10-06 DIAGNOSIS — F17210 Nicotine dependence, cigarettes, uncomplicated: Secondary | ICD-10-CM | POA: Diagnosis not present

## 2016-10-06 LAB — URINALYSIS, ROUTINE W REFLEX MICROSCOPIC
Bilirubin Urine: NEGATIVE
Glucose, UA: NEGATIVE mg/dL
Ketones, ur: 15 mg/dL — AB
Nitrite: POSITIVE — AB
PH: 6 (ref 5.0–8.0)
Protein, ur: NEGATIVE mg/dL
Specific Gravity, Urine: >1.03 — ABNORMAL HIGH (ref 1.005–1.030)

## 2016-10-06 LAB — URINE MICROSCOPIC-ADD ON

## 2016-10-06 MED ORDER — CEPHALEXIN 500 MG PO CAPS: 500.0000 mg | ORAL_CAPSULE | Freq: Four times a day (QID) | ORAL | 0 refills | Status: DC

## 2016-10-06 MED ORDER — CEPHALEXIN 500 MG PO CAPS
500.0000 mg | ORAL_CAPSULE | Freq: Once | ORAL | Status: AC
  Administered 2016-10-06: 500 mg via ORAL
  Filled 2016-10-06: qty 1

## 2016-10-06 NOTE — Discharge Instructions (Signed)
Pregnancy and Urinary Tract Infection  A urinary tract infection (UTI) is a bacterial infection of the urinary tract. Infection of the urinary tract can include the ureters, kidneys (pyelonephritis), bladder (cystitis), and urethra (urethritis). All pregnant women should be screened for bacteria in the urinary tract. Identifying and treating a UTI will decrease the risk of preterm labor and developing more serious infections in both the mother and baby.  CAUSES  Bacteria germs cause almost all UTIs.   RISK FACTORS  Many factors can increase your chances of getting a UTI during pregnancy. These include:  · Having a short urethra.  · Poor toilet and hygiene habits.  · Sexual intercourse.  · Blockage of urine along the urinary tract.  · Problems with the pelvic muscles or nerves.  · Diabetes.  · Obesity.  · Bladder problems after having several children.  · Previous history of UTI.  SIGNS AND SYMPTOMS   · Pain, burning, or a stinging feeling when urinating.  · Suddenly feeling the need to urinate right away (urgency).  · Loss of bladder control (urinary incontinence).  · Frequent urination, more than is common with pregnancy.  · Lower abdominal or back discomfort.  · Cloudy urine.  · Blood in the urine (hematuria).  · Fever.   When the kidneys are infected, the symptoms may be:  · Back pain.  · Flank pain on the right side more so than the left.  · Fever.  · Chills.  · Nausea.  · Vomiting.  DIAGNOSIS   A urinary tract infection is usually diagnosed through urine tests. Additional tests and procedures are sometimes done. These may include:  · Ultrasound exam of the kidneys, ureters, bladder, and urethra.  · Looking in the bladder with a lighted tube (cystoscopy).  TREATMENT  Typically, UTIs can be treated with antibiotic medicines.   HOME CARE INSTRUCTIONS   · Only take over-the-counter or prescription medicines as directed by your health care provider. If you were prescribed antibiotics, take them as directed. Finish  them even if you start to feel better.  · Drink enough fluids to keep your urine clear or pale yellow.  · Do not have sexual intercourse until the infection is gone and your health care provider says it is okay.  · Make sure you are tested for UTIs throughout your pregnancy. These infections often come back.   Preventing a UTI in the Future  · Practice good toilet habits. Always wipe from front to back. Use the tissue only once.  · Do not hold your urine. Empty your bladder as soon as possible when the urge comes.  · Do not douche or use deodorant sprays.  · Wash with soap and warm water around the genital area and the anus.  · Empty your bladder before and after sexual intercourse.  · Wear underwear with a cotton crotch.  · Avoid caffeine and carbonated drinks. They can irritate the bladder.  · Drink cranberry juice or take cranberry pills. This may decrease the risk of getting a UTI.  · Do not drink alcohol.  · Keep all your appointments and tests as scheduled.   SEEK MEDICAL CARE IF:   · Your symptoms get worse.  · You are still having fevers 2 or more days after treatment begins.  · You have a rash.  · You feel that you are having problems with medicines prescribed.  · You have abnormal vaginal discharge.  SEEK IMMEDIATE MEDICAL CARE IF:   · You have back or flank   pain.  · You have chills.  · You have blood in your urine.  · You have nausea and vomiting.  · You have contractions of your uterus.  · You have a gush of fluid from the vagina.  MAKE SURE YOU:  · Understand these instructions.    · Will watch your condition.    · Will get help right away if you are not doing well or get worse.       This information is not intended to replace advice given to you by your health care provider. Make sure you discuss any questions you have with your health care provider.     Document Released: 04/13/2011 Document Revised: 10/07/2013 Document Reviewed: 07/16/2013  Elsevier Interactive Patient Education ©2016 [employer]  Inc[employer]

## 2016-10-06 NOTE — MAU Note (Signed)
Pt states she was told in the office she should be about 19 weeks now when she had her 20 week anatomy scan.  Pt states she was told her due date should be 03/31/17 but those two pieces of information do not match.  Due date was created with episode to match 19 weeks because when Dr. Ellyn HackBovard called to say the pt was coming in she reported she was 19 weeks.  Please confirm dating via prenatal record next time pt is seen and update dating on pt's pregnancy episode.

## 2016-10-06 NOTE — MAU Note (Signed)
Pt states she is having a pain that is sharp and goes from the lower left abdomen to the back and is sharp in her hip.

## 2016-10-06 NOTE — MAU Provider Note (Signed)
History   G2P1001 @ 14.6 wks in with left lower quad pain that started last night and was relieved by getting off her feet then todayt she was at work apx for [employer] and the pain started again. Has not taken and thing for the pain and declines ant medication here. Pain is lower left quadrant sharp shooting in nature that radiates into her hip. Pt denies any constipation. States she just finished med for UTI.  CSN: [id number]  Arrival date & time 10/06/16  1752   None     Chief Complaint  Patient presents with  . Hip Pain  . Abdominal Pain  . Back Pain    HPI  Past Medical History:  Diagnosis Date  . Abnormal Pap smear    cryo/ freezing, normal pap after  . Dysrhythmia    svt  . SVD (spontaneous vaginal delivery) 01/11/2014    Past Surgical History:  Procedure Laterality Date  . CARDIAC ELECTROPHYSIOLOGY MAPPING AND ABLATION      No family history on file.  Social History  Substance Use Topics  . Smoking status: Current Every Day Smoker    Packs/day: 0.50    Types: Cigarettes  . Smokeless tobacco: Not on file  . Alcohol use No    OB History    Gravida Para Term Preterm AB Living   2 1 1     1    SAB TAB Ectopic Multiple Live Births           1      Review of Systems  Constitutional: Negative.   HENT: Negative.   Eyes: Negative.   Respiratory: Negative.   Cardiovascular: Negative.   Gastrointestinal: Positive for abdominal pain.  Endocrine: Negative.   Genitourinary: Negative.   Musculoskeletal: Negative.   Skin: Negative.   Allergic/Immunologic: Negative.   Neurological: Negative.   Hematological: Negative.   Psychiatric/Behavioral: Negative.     Allergies  Codeine and Hydrocodone  Home Medications    BP 102/65 (BP Location: Left Arm)   Pulse 80   Temp 98 F (36.7 C) (Oral)   Resp 16   SpO2 100%   Physical Exam  Constitutional: She is oriented to person, place, and time. She appears well-developed and well-nourished.  HENT:  Head:  Normocephalic.  Eyes: Pupils are equal, round, and reactive to light.  Neck: Normal range of motion.  Cardiovascular: Normal rate, normal heart sounds and intact distal pulses.   Pulmonary/Chest: Effort normal and breath sounds normal.  Abdominal: Soft. Bowel sounds are normal. There is tenderness.  Musculoskeletal: Normal range of motion.  Neurological: She is alert and oriented to person, place, and time. She has normal reflexes.  Skin: Skin is warm and dry.  Psychiatric: She has a normal mood and affect. Her behavior is normal. Judgment and thought content normal.    MAU Course  Procedures (including critical care time)  Labs Reviewed  URINALYSIS, ROUTINE W REFLEX MICROSCOPIC (NOT AT [facility])   No results found.   No diagnosis found.    MDM  preg at 19 wks LLQ pain  UTI FHR strong and reg per doppler. abd soft sl muskuloskelital tenderness with palp in LLQ. Exam otherwise normal. Pt ratyes pain a 8 but declines pain management. POC discussed with Dr. Ellyn HackBovard. Will send culture and treat with keflex QID.

## 2016-10-09 LAB — URINE CULTURE: Culture: >=100000 — AB

## 2016-12-31 NOTE — L&D Delivery Note (Signed)
Delivery Note At 1:44 PM a viable and healthy female was delivered via Vaginal, Spontaneous Delivery (Presentation: OA; ROT ).  APGAR: 9,9 ; weight P .   Placenta status: delivered, intact .  Cord: 3V with the following complications: nuchal x 1.    Anesthesia:  epidural Episiotomy:  none Lacerations:  2nd degree Suture Repair: 3.0 vicryl rapide Est. Blood Loss (mL):  200  Mom to postpartum.  Baby to Couplet care / Skin to Skin.  Bovard-Stuckert, Carlo Lorson 02/27/2017, 2:03 PM  O+/Br/Tdap in PNC/RI/ desires   D/w pt r/b/a of PPBTL - questions answered will proceed.

## 2017-01-23 ENCOUNTER — Ambulatory Visit: Payer: Medicaid Other | Admitting: *Deleted

## 2017-01-23 LAB — OB RESULTS CONSOLE GBS: GBS: NEGATIVE

## 2017-01-23 MED ORDER — BETAMETHASONE SOD PHOS & ACET 6 (3-3) MG/ML IJ SUSP: 12.0000 mg | Freq: Once | INTRAMUSCULAR | Status: DC

## 2017-01-23 NOTE — Progress Notes (Signed)
Pt sent from Dr. Shella SpearingBanga's office to receive betamethasone injection. We did not have the order from the office at the time pt arrived. Order sent to pharmacy [facility] Pt became upset while waiting for medication to be prepared. She stated that she needed to get to work.  It was explained to her that the medication has to be prepared by the pharmacy. Pt waited only a few additional minutes, then left the building. I called pt to schedule the injection appt for tomorrow and heard a message stating that the number dialed [phone]) is not in service. I then called Dr. Shella SpearingBanga's office and [facility] to make her aware of the situation. She provided another telephone number [phone]. I called that number and left a detailed message stating that pt may return tomorrow to receive her injection. She may schedule appt or just come in @ 8-11 am or 1-3 pm.

## 2017-02-25 ENCOUNTER — Encounter (HOSPITAL_COMMUNITY): Payer: Self-pay

## 2017-02-25 ENCOUNTER — Inpatient Hospital Stay (HOSPITAL_COMMUNITY)
Admission: AD | Admit: 2017-02-25 | Discharge: 2017-02-25 | Disposition: A | Discharge status: Home / Self Care | Payer: Medicaid Other | Source: Ambulatory Visit | Attending: Obstetrics and Gynecology | Admitting: Obstetrics and Gynecology

## 2017-02-25 ENCOUNTER — Encounter (HOSPITAL_COMMUNITY): Payer: Self-pay | Admitting: *Deleted

## 2017-02-25 ENCOUNTER — Telehealth (HOSPITAL_COMMUNITY): Payer: Self-pay | Admitting: *Deleted

## 2017-02-25 DIAGNOSIS — N898 Other specified noninflammatory disorders of vagina: Secondary | ICD-10-CM

## 2017-02-25 DIAGNOSIS — O26893 Other specified pregnancy related conditions, third trimester: Secondary | ICD-10-CM | POA: Insufficient documentation

## 2017-02-25 DIAGNOSIS — O99419 Diseases of the circulatory system complicating pregnancy, unspecified trimester: Secondary | ICD-10-CM

## 2017-02-25 DIAGNOSIS — B3731 Acute candidiasis of vulva and vagina: Secondary | ICD-10-CM

## 2017-02-25 DIAGNOSIS — Z3A39 39 weeks gestation of pregnancy: Secondary | ICD-10-CM | POA: Diagnosis not present

## 2017-02-25 DIAGNOSIS — B373 Candidiasis of vulva and vagina: Secondary | ICD-10-CM

## 2017-02-25 DIAGNOSIS — I519 Heart disease, unspecified: Secondary | ICD-10-CM

## 2017-02-25 DIAGNOSIS — O479 False labor, unspecified: Secondary | ICD-10-CM | POA: Diagnosis not present

## 2017-02-25 LAB — WET PREP, GENITAL
CLUE CELLS WET PREP: NONE SEEN
Sperm: NONE SEEN
Trich, Wet Prep: NONE SEEN

## 2017-02-25 MED ORDER — TERCONAZOLE 0.4 % VA CREA: 1.0000 | TOPICAL_CREAM | Freq: Every day | VAGINAL | 0 refills | Status: DC

## 2017-02-25 MED ORDER — ZOLPIDEM TARTRATE 5 MG PO TABS: 5.0000 mg | ORAL_TABLET | Freq: Once | ORAL | Status: DC
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
## 2017-02-25 NOTE — MAU Provider Note (Signed)
S: 36 y.o. G2P1001@[redacted]w[redacted]d  presents to MAU reporting leakage of clear fluid after she stood up from urinating today. She describes this as an ongoing problem, noting it has occurred off and on x 1.5 weeks.  She has not tried any treatments, nothing makes her symptom better or worse.   She is contracting, reporting painful irregular contractions x 2 weeks, and these are unchanged in intensity today.   She reports good fetal movement, denies LOF, vaginal bleeding, vaginal itching/burning, urinary symptoms, h/a, dizziness, n/v, or fever/chills.    O: BP 110/65 (BP Location: Left Arm)   Pulse 85   Temp 97.8 F (36.6 C) (Oral)   Resp 16    VS reviewed, nursing note reviewed,  Constitutional: well developed, well nourished, no distress HEENT: normocephalic CV: normal rate Pulm/chest wall: normal effort Abdomen: soft Neuro: alert and oriented x 3 Skin: warm, dry Psych: affect normal  SSE with negative pooling but copious amount of thick white curd-like vaginal discharge  Slide with negative ferning  FHR baseline 135 with moderate variability, positive accels, no decels Toco with contractions every 5-6 minutes, mild to palpation  A: Labor evaluation Vaginal discharge in pregnancy  P: RN to complete labor evaluation and call Dr Darral DashMeisinger Wet prep pending  Kathy [name], CNM 9:19 PM

## 2017-02-25 NOTE — Telephone Encounter (Signed)
Preadmission screen  

## 2017-02-25 NOTE — Discharge Instructions (Signed)

## 2017-02-25 NOTE — MAU Note (Signed)
Pt presents complaining of SROM at 1845 with a continuous trickle of clear fluid. Reports contractions every 3-5 minutes. Denies bleeding. Reports good fetal movement. 4cm in office Friday.

## 2017-02-26 ENCOUNTER — Encounter (HOSPITAL_COMMUNITY): Payer: Self-pay

## 2017-02-26 DIAGNOSIS — Z3483 Encounter for supervision of other normal pregnancy, third trimester: Secondary | ICD-10-CM

## 2017-02-26 HISTORY — DX: Encounter for supervision of other normal pregnancy, third trimester: Z34.83

## 2017-02-26 NOTE — H&P (Signed)
BREEZIE MICUCCI is a 36 y.o. female G2P1001 at 39+ for IOL given contractions, term and favorable cervix. Relatively uncomplicated prenatal care - pregnancy dated by 13 week [place]. Pt is a smoker, has decreased with pregnancy.  Pt desires BTL - d/w pt r/b/a - signed papers during pregnancy.  Also elevated Down's risk 1:40 on first trimester screen - nl low risk female on Panorama, nl AFP.  Pt has been very uncomfortable at end of pregnancy.    OB History    Gravida Para Term Preterm AB Living   2 1 1     1    SAB TAB Ectopic Multiple Live Births           1    G1 01/11/14 - female 7#1 SVD G2 present  +abn pap - cryo vs laser 2007, nl since No STD  Past Medical History:  Diagnosis Date  . Abnormal Pap smear    cryo/ freezing, normal pap after  . Dysrhythmia    svt  . Headache   . Normal pregnancy in multigravida in third trimester 02/26/2017  . SVD (spontaneous vaginal delivery) 01/11/2014  . Vaginal Pap smear, abnormal    Past Surgical History:  Procedure Laterality Date  . CARDIAC ELECTROPHYSIOLOGY MAPPING AND ABLATION    . GYNECOLOGIC CRYOSURGERY     Family History: high cholesterol, HTN, anxiety,CVA, DM, COPD, Cushings Social History:  reports that she has been smoking Cigarettes.  She has been smoking about 0.50 packs per day. She has never used smokeless tobacco. She reports that she does not drink alcohol or use drugs.SAHM, single - in relationship  Meds PNV All: coconut, latex, tomato; NKDA     Maternal Diabetes: No Genetic Screening: Abnormal:  Results: Elevated risk of Trisomy 21, Other:nl panorama Maternal Ultrasounds/Referrals: Abnormal:  Findings:   Other:short femurs Fetal Ultrasounds or other Referrals:  None Maternal Substance Abuse:  Yes:  Type: Smoker Significant Maternal Medications:  None Significant Maternal Lab Results:  Lab values include: Group B Strep negative Other Comments:  1:240 DSR, nl panorama female  Review of Systems  Constitutional: Negative.    HENT: Negative.   Eyes: Negative.   Respiratory: Negative.   Cardiovascular: Negative.   Gastrointestinal: Negative.   Genitourinary: Negative.   Musculoskeletal: Positive for back pain and myalgias.  Skin: Negative.   Neurological: Negative.   Psychiatric/Behavioral: Negative.    Maternal Medical History:  Contractions: Onset was more than 2 days ago.   Frequency: regular.   Perceived severity is strong.    Fetal activity: Perceived fetal activity is normal.    Prenatal Complications - Diabetes: none.      unknown if currently breastfeeding. Maternal Exam:  Uterine Assessment: Contraction strength is moderate.  Contraction frequency is regular.   Abdomen: Patient reports no abdominal tenderness. Fundal height is appropriate for gestation.   Estimated fetal weight is 7-8#.   Fetal presentation: vertex  Introitus: Normal vulva. Normal vagina.  Pelvis: adequate for delivery.   Cervix: Cervix evaluated by digital exam.     Physical Exam  Constitutional: She is oriented to person, place, and time. She appears well-developed and well-nourished.  HENT:  Head: Normocephalic and atraumatic.  Cardiovascular: Normal rate and regular rhythm.   Respiratory: Effort normal and breath sounds normal. No respiratory distress. She has no wheezes.  GI: Soft. Bowel sounds are normal. She exhibits no distension. There is no tenderness.  Musculoskeletal: Normal range of motion.  Neurological: She is alert and oriented to person, place, and time.  Skin: Skin is warm and dry.  Psychiatric: She has a normal mood and affect. Her behavior is normal.    Prenatal labs: ABO, Rh: O/Positive/-- (08/25 0000) Antibody: Negative (08/25 0000) Rubella: Immune (08/25 0000) RPR: Nonreactive (08/25 0000)  HBsAg: Negative (08/25 0000)  HIV: Non-reactive (08/25 0000)  GBS: Negative (01/24 0000)   Dated by 13 wk US Tdap 12/22 US nl anat, ant plac, female Good growth - short femurs  Hgb 13.6/Plt  291/UrCx + - treated f/u neg/ GC neg/Chl neg/glucola 61/CF neg  Assessment/Plan: 35yo G2P1001 at 39+ for IOL given term and favorable - nl panormama - female Induction with AROM and pitocin Epidural, IV pain meds and/or nitrous prn for pain Expect SVD D/w pt r/b/a of BTL   Bovard-Stuckert, Tyrion Glaude 02/26/2017, 9:04 PM

## 2017-02-27 ENCOUNTER — Inpatient Hospital Stay (HOSPITAL_COMMUNITY)
Admission: RE | Admit: 2017-02-27 | Discharge: 2017-02-28 | DRG: 767 | Disposition: A | Discharge status: Home / Self Care | Payer: Medicaid Other | Source: Ambulatory Visit | Attending: Obstetrics and Gynecology | Admitting: Obstetrics and Gynecology

## 2017-02-27 ENCOUNTER — Encounter (HOSPITAL_COMMUNITY)
Admission: RE | Disposition: A | Discharge status: Home / Self Care | Payer: Self-pay | Source: Ambulatory Visit | Attending: Obstetrics and Gynecology

## 2017-02-27 ENCOUNTER — Inpatient Hospital Stay (HOSPITAL_COMMUNITY): Payer: Medicaid Other | Admitting: Anesthesiology

## 2017-02-27 ENCOUNTER — Encounter (HOSPITAL_COMMUNITY): Payer: Self-pay

## 2017-02-27 DIAGNOSIS — F17210 Nicotine dependence, cigarettes, uncomplicated: Secondary | ICD-10-CM | POA: Diagnosis present

## 2017-02-27 DIAGNOSIS — O99334 Smoking (tobacco) complicating childbirth: Secondary | ICD-10-CM | POA: Diagnosis present

## 2017-02-27 DIAGNOSIS — Z3A39 39 weeks gestation of pregnancy: Secondary | ICD-10-CM | POA: Diagnosis not present

## 2017-02-27 DIAGNOSIS — Z9851 Tubal ligation status: Secondary | ICD-10-CM

## 2017-02-27 DIAGNOSIS — Z8249 Family history of ischemic heart disease and other diseases of the circulatory system: Secondary | ICD-10-CM | POA: Diagnosis not present

## 2017-02-27 DIAGNOSIS — Z3493 Encounter for supervision of normal pregnancy, unspecified, third trimester: Secondary | ICD-10-CM | POA: Diagnosis present

## 2017-02-27 DIAGNOSIS — Z823 Family history of stroke: Secondary | ICD-10-CM

## 2017-02-27 DIAGNOSIS — Z833 Family history of diabetes mellitus: Secondary | ICD-10-CM

## 2017-02-27 DIAGNOSIS — Z3483 Encounter for supervision of other normal pregnancy, third trimester: Secondary | ICD-10-CM

## 2017-02-27 DIAGNOSIS — Z302 Encounter for sterilization: Secondary | ICD-10-CM

## 2017-02-27 HISTORY — DX: Tubal ligation status: Z98.51

## 2017-02-27 HISTORY — PX: TUBAL LIGATION: SHX77

## 2017-02-27 HISTORY — DX: Encounter for supervision of other normal pregnancy, third trimester: Z34.83

## 2017-02-27 LAB — TYPE AND SCREEN
ABO/RH(D): O POS
ANTIBODY SCREEN: NEGATIVE

## 2017-02-27 LAB — CBC
HEMATOCRIT: 34.9 % — AB (ref 36.0–46.0)
HEMOGLOBIN: 12.1 g/dL (ref 12.0–15.0)
MCH: 29.4 pg (ref 26.0–34.0)
MCHC: 34.7 g/dL (ref 30.0–36.0)
MCV: 84.9 fL (ref 78.0–100.0)
Platelets: 194 10*3/uL (ref 150–400)
RBC: 4.11 MIL/uL (ref 3.87–5.11)
RDW: 13.9 % (ref 11.5–15.5)
WBC: 8.1 10*3/uL (ref 4.0–10.5)

## 2017-02-27 SURGERY — LIGATION, FALLOPIAN TUBE, POSTPARTUM
Anesthesia: Epidural | Site: Abdomen | Laterality: Bilateral

## 2017-02-27 MED ORDER — SODIUM BICARBONATE 8.4 % IV SOLN
INTRAVENOUS | Status: DC | PRN
  Administered 2017-02-27 (×5): 5 mL via EPIDURAL

## 2017-02-27 MED ORDER — BUPIVACAINE HCL (PF) 0.25 % IJ SOLN
INTRAMUSCULAR | Status: DC | PRN
  Administered 2017-02-27 (×2): 5 mL via EPIDURAL

## 2017-02-27 MED ORDER — SOD CITRATE-CITRIC ACID 500-334 MG/5ML PO SOLN: 30.0000 mL | ORAL | Status: DC | PRN

## 2017-02-27 MED ORDER — FENTANYL CITRATE (PF) 100 MCG/2ML IJ SOLN
INTRAMUSCULAR | Status: DC | PRN
  Administered 2017-02-27 (×5): 50 ug via INTRAVENOUS

## 2017-02-27 MED ORDER — PRENATAL MULTIVITAMIN CH
1.0000 | ORAL_TABLET | Freq: Every day | ORAL | Status: DC
  Administered 2017-02-28: 1 via ORAL
  Filled 2017-02-27: qty 1

## 2017-02-27 MED ORDER — DEXAMETHASONE SODIUM PHOSPHATE 4 MG/ML IJ SOLN
INTRAMUSCULAR | Status: DC | PRN
  Administered 2017-02-27: 4 mg via INTRAVENOUS

## 2017-02-27 MED ORDER — BUPIVACAINE HCL (PF) 0.25 % IJ SOLN
INTRAMUSCULAR | Status: DC | PRN
  Administered 2017-02-27: 10 mL

## 2017-02-27 MED ORDER — LIDOCAINE HCL (PF) 1 % IJ SOLN
INTRAMUSCULAR | Status: DC | PRN
  Administered 2017-02-27: 5 mL
  Administered 2017-02-27: 2 mL
  Administered 2017-02-27: 3 mL

## 2017-02-27 MED ORDER — OXYCODONE HCL 5 MG PO TABS
10.0000 mg | ORAL_TABLET | ORAL | Status: DC | PRN
  Administered 2017-02-28 (×3): 10 mg via ORAL
  Filled 2017-02-27 (×3): qty 2

## 2017-02-27 MED ORDER — SIMETHICONE 80 MG PO CHEW: 80.0000 mg | CHEWABLE_TABLET | ORAL | Status: DC | PRN

## 2017-02-27 MED ORDER — SCOPOLAMINE 1 MG/3DAYS TD PT72
MEDICATED_PATCH | TRANSDERMAL | Status: AC
  Filled 2017-02-27: qty 1

## 2017-02-27 MED ORDER — DIPHENHYDRAMINE HCL 50 MG/ML IJ SOLN: 12.5000 mg | INTRAMUSCULAR | Status: DC | PRN

## 2017-02-27 MED ORDER — OXYCODONE-ACETAMINOPHEN 5-325 MG PO TABS: 2.0000 | ORAL_TABLET | ORAL | Status: DC | PRN

## 2017-02-27 MED ORDER — PHENYLEPHRINE 40 MCG/ML (10ML) SYRINGE FOR IV PUSH (FOR BLOOD PRESSURE SUPPORT)
80.0000 ug | PREFILLED_SYRINGE | INTRAVENOUS | Status: DC | PRN
  Filled 2017-02-27 (×2): qty 10

## 2017-02-27 MED ORDER — OXYTOCIN BOLUS FROM INFUSION
500.0000 mL | Freq: Once | INTRAVENOUS | Status: AC
  Administered 2017-02-27: 500 mL via INTRAVENOUS

## 2017-02-27 MED ORDER — EPHEDRINE 5 MG/ML INJ: 10.0000 mg | INTRAVENOUS | Status: DC | PRN

## 2017-02-27 MED ORDER — FENTANYL 2.5 MCG/ML BUPIVACAINE 1/10 % EPIDURAL INFUSION (WH - ANES)
14.0000 mL/h | INTRAMUSCULAR | Status: DC | PRN
  Administered 2017-02-27 (×2): 14 mL/h via EPIDURAL
  Filled 2017-02-27: qty 100

## 2017-02-27 MED ORDER — LIDOCAINE HCL (PF) 1 % IJ SOLN
30.0000 mL | INTRAMUSCULAR | Status: DC | PRN
Start: 2017-02-27 — End: 2017-02-27
  Filled 2017-02-27: qty 30

## 2017-02-27 MED ORDER — BUPIVACAINE HCL (PF) 0.25 % IJ SOLN
INTRAMUSCULAR | Status: AC
  Filled 2017-02-27: qty 30

## 2017-02-27 MED ORDER — FENTANYL CITRATE (PF) 250 MCG/5ML IJ SOLN
INTRAMUSCULAR | Status: AC
  Filled 2017-02-27: qty 5

## 2017-02-27 MED ORDER — OXYCODONE-ACETAMINOPHEN 5-325 MG PO TABS: 1.0000 | ORAL_TABLET | ORAL | Status: DC | PRN

## 2017-02-27 MED ORDER — SUCCINYLCHOLINE CHLORIDE 20 MG/ML IJ SOLN
INTRAMUSCULAR | Status: DC | PRN
  Administered 2017-02-27: 100 mg via INTRAVENOUS

## 2017-02-27 MED ORDER — ONDANSETRON HCL 4 MG/2ML IJ SOLN
INTRAMUSCULAR | Status: DC | PRN
  Administered 2017-02-27: 4 mg via INTRAVENOUS

## 2017-02-27 MED ORDER — BENZOCAINE-MENTHOL 20-0.5 % EX AERO
1.0000 "application " | INHALATION_SPRAY | CUTANEOUS | Status: DC | PRN
  Administered 2017-02-28: 1 via TOPICAL
  Filled 2017-02-27: qty 56

## 2017-02-27 MED ORDER — TERBUTALINE SULFATE 1 MG/ML IJ SOLN
0.2500 mg | Freq: Once | INTRAMUSCULAR | Status: DC | PRN
Start: 2017-02-27 — End: 2017-02-27

## 2017-02-27 MED ORDER — SENNOSIDES-DOCUSATE SODIUM 8.6-50 MG PO TABS
2.0000 | ORAL_TABLET | ORAL | Status: DC
  Administered 2017-02-27: 2 via ORAL
  Filled 2017-02-27: qty 2

## 2017-02-27 MED ORDER — ONDANSETRON HCL 4 MG PO TABS: 4.0000 mg | ORAL_TABLET | ORAL | Status: DC | PRN

## 2017-02-27 MED ORDER — OXYTOCIN 40 UNITS IN LACTATED RINGERS INFUSION - SIMPLE MED
2.5000 [IU]/h | INTRAVENOUS | Status: DC
  Administered 2017-02-27: 2.5 [IU]/h via INTRAVENOUS

## 2017-02-27 MED ORDER — LIDOCAINE-EPINEPHRINE (PF) 2 %-1:200000 IJ SOLN
INTRAMUSCULAR | Status: AC
  Filled 2017-02-27: qty 20

## 2017-02-27 MED ORDER — ACETAMINOPHEN 325 MG PO TABS
650.0000 mg | ORAL_TABLET | ORAL | Status: DC | PRN
  Administered 2017-02-27 – 2017-02-28 (×3): 650 mg via ORAL
  Filled 2017-02-27 (×3): qty 2

## 2017-02-27 MED ORDER — PROPOFOL 10 MG/ML IV BOLUS
INTRAVENOUS | Status: DC | PRN
  Administered 2017-02-27: 160 mg via INTRAVENOUS

## 2017-02-27 MED ORDER — DEXAMETHASONE SODIUM PHOSPHATE 4 MG/ML IJ SOLN
INTRAMUSCULAR | Status: AC
  Filled 2017-02-27: qty 1

## 2017-02-27 MED ORDER — ONDANSETRON HCL 4 MG/2ML IJ SOLN
INTRAMUSCULAR | Status: AC
  Filled 2017-02-27: qty 2

## 2017-02-27 MED ORDER — MIDAZOLAM HCL 2 MG/2ML IJ SOLN
INTRAMUSCULAR | Status: AC
  Filled 2017-02-27: qty 2

## 2017-02-27 MED ORDER — LACTATED RINGERS IV SOLN: 500.0000 mL | Freq: Once | INTRAVENOUS | Status: DC

## 2017-02-27 MED ORDER — PHENYLEPHRINE 40 MCG/ML (10ML) SYRINGE FOR IV PUSH (FOR BLOOD PRESSURE SUPPORT): 80.0000 ug | PREFILLED_SYRINGE | INTRAVENOUS | Status: DC | PRN

## 2017-02-27 MED ORDER — SODIUM BICARBONATE 8.4 % IV SOLN
INTRAVENOUS | Status: AC
  Filled 2017-02-27: qty 50

## 2017-02-27 MED ORDER — LACTATED RINGERS IV SOLN
INTRAVENOUS | Status: DC
  Administered 2017-02-27 (×4): via INTRAVENOUS

## 2017-02-27 MED ORDER — SOD CITRATE-CITRIC ACID 500-334 MG/5ML PO SOLN
30.0000 mL | Freq: Once | ORAL | Status: AC
  Administered 2017-02-27: 30 mL via ORAL
  Filled 2017-02-27: qty 15

## 2017-02-27 MED ORDER — ZOLPIDEM TARTRATE 5 MG PO TABS: 5.0000 mg | ORAL_TABLET | Freq: Every evening | ORAL | Status: DC | PRN

## 2017-02-27 MED ORDER — FENTANYL CITRATE (PF) 100 MCG/2ML IJ SOLN
INTRAMUSCULAR | Status: AC
  Administered 2017-02-27: 50 ug via INTRAVENOUS
  Filled 2017-02-27: qty 2

## 2017-02-27 MED ORDER — BUTORPHANOL TARTRATE 1 MG/ML IJ SOLN: 1.0000 mg | INTRAMUSCULAR | Status: DC | PRN

## 2017-02-27 MED ORDER — SCOPOLAMINE 1 MG/3DAYS TD PT72
MEDICATED_PATCH | TRANSDERMAL | Status: DC | PRN
  Administered 2017-02-27: 1 via TRANSDERMAL

## 2017-02-27 MED ORDER — LIDOCAINE HCL (CARDIAC) 20 MG/ML IV SOLN
INTRAVENOUS | Status: AC
  Filled 2017-02-27: qty 5

## 2017-02-27 MED ORDER — LACTATED RINGERS IV SOLN
500.0000 mL | INTRAVENOUS | Status: DC | PRN
Start: 2017-02-27 — End: 2017-02-27

## 2017-02-27 MED ORDER — DIPHENHYDRAMINE HCL 25 MG PO CAPS
25.0000 mg | ORAL_CAPSULE | Freq: Four times a day (QID) | ORAL | Status: DC | PRN
  Administered 2017-02-27 – 2017-02-28 (×3): 25 mg via ORAL
  Filled 2017-02-27 (×4): qty 1

## 2017-02-27 MED ORDER — KETOROLAC TROMETHAMINE 30 MG/ML IJ SOLN
INTRAMUSCULAR | Status: DC | PRN
  Administered 2017-02-27: 30 mg via INTRAVENOUS

## 2017-02-27 MED ORDER — PROMETHAZINE HCL 25 MG/ML IJ SOLN: 6.2500 mg | INTRAMUSCULAR | Status: DC | PRN

## 2017-02-27 MED ORDER — MIDAZOLAM HCL 5 MG/5ML IJ SOLN
INTRAMUSCULAR | Status: DC | PRN
  Administered 2017-02-27 (×3): 1 mg via INTRAVENOUS

## 2017-02-27 MED ORDER — LACTATED RINGERS IV SOLN
500.0000 mL | Freq: Once | INTRAVENOUS | Status: AC
  Administered 2017-02-27: 500 mL via INTRAVENOUS

## 2017-02-27 MED ORDER — KETOROLAC TROMETHAMINE 30 MG/ML IJ SOLN: 30.0000 mg | Freq: Once | INTRAMUSCULAR | Status: DC | PRN

## 2017-02-27 MED ORDER — DIBUCAINE 1 % RE OINT: 1.0000 "application " | TOPICAL_OINTMENT | RECTAL | Status: DC | PRN

## 2017-02-27 MED ORDER — OXYTOCIN 40 UNITS IN LACTATED RINGERS INFUSION - SIMPLE MED
1.0000 m[IU]/min | INTRAVENOUS | Status: DC
  Administered 2017-02-27: 2 m[IU]/min via INTRAVENOUS
  Filled 2017-02-27: qty 1000

## 2017-02-27 MED ORDER — IBUPROFEN 600 MG PO TABS
600.0000 mg | ORAL_TABLET | Freq: Four times a day (QID) | ORAL | Status: DC
  Administered 2017-02-27 – 2017-02-28 (×3): 600 mg via ORAL
  Filled 2017-02-27 (×3): qty 1

## 2017-02-27 MED ORDER — LACTATED RINGERS IV SOLN: INTRAVENOUS | Status: DC

## 2017-02-27 MED ORDER — ACETAMINOPHEN 325 MG PO TABS: 650.0000 mg | ORAL_TABLET | ORAL | Status: DC | PRN

## 2017-02-27 MED ORDER — ONDANSETRON HCL 4 MG/2ML IJ SOLN: 4.0000 mg | INTRAMUSCULAR | Status: DC | PRN

## 2017-02-27 MED ORDER — LIDOCAINE HCL (CARDIAC) 20 MG/ML IV SOLN
INTRAVENOUS | Status: DC | PRN
  Administered 2017-02-27: 80 mg via INTRATRACHEAL

## 2017-02-27 MED ORDER — PROPOFOL 10 MG/ML IV BOLUS
INTRAVENOUS | Status: AC
  Filled 2017-02-27: qty 20

## 2017-02-27 MED ORDER — ONDANSETRON HCL 4 MG/2ML IJ SOLN: 4.0000 mg | Freq: Four times a day (QID) | INTRAMUSCULAR | Status: DC | PRN

## 2017-02-27 MED ORDER — SUCCINYLCHOLINE CHLORIDE 200 MG/10ML IV SOSY
PREFILLED_SYRINGE | INTRAVENOUS | Status: AC
  Filled 2017-02-27: qty 10

## 2017-02-27 MED ORDER — WITCH HAZEL-GLYCERIN EX PADS: 1.0000 "application " | MEDICATED_PAD | CUTANEOUS | Status: DC | PRN

## 2017-02-27 MED ORDER — LACTATED RINGERS IV SOLN
INTRAVENOUS | Status: DC
Start: 2017-02-27 — End: 2017-02-28
  Administered 2017-02-27: 22:00:00 via INTRAVENOUS

## 2017-02-27 MED ORDER — KETOROLAC TROMETHAMINE 30 MG/ML IJ SOLN
INTRAMUSCULAR | Status: AC
  Filled 2017-02-27: qty 1

## 2017-02-27 MED ORDER — FENTANYL CITRATE (PF) 100 MCG/2ML IJ SOLN
25.0000 ug | INTRAMUSCULAR | Status: DC | PRN
  Administered 2017-02-27 (×2): 50 ug via INTRAVENOUS

## 2017-02-27 MED ORDER — OXYCODONE HCL 5 MG PO TABS
5.0000 mg | ORAL_TABLET | ORAL | Status: DC | PRN
  Administered 2017-02-27: 5 mg via ORAL
  Filled 2017-02-27: qty 1

## 2017-02-27 SURGICAL SUPPLY — 29 items
APL SKNCLS STERI-STRIP NONHPOA (GAUZE/BANDAGES/DRESSINGS) ×1
BENZOIN TINCTURE PRP APPL 2/3 (GAUZE/BANDAGES/DRESSINGS) ×3 IMPLANT
BLADE SURG 11 STRL SS (BLADE) ×3 IMPLANT
CLOSURE WOUND 1/2 X4 (GAUZE/BANDAGES/DRESSINGS) ×1
CLOTH BEACON ORANGE TIMEOUT ST (SAFETY) ×3 IMPLANT
CONTAINER PREFILL 10% NBF 15ML (MISCELLANEOUS) ×4 IMPLANT
DRSG OPSITE POSTOP 3X4 (GAUZE/BANDAGES/DRESSINGS) ×3 IMPLANT
DURAPREP 26ML APPLICATOR (WOUND CARE) ×3 IMPLANT
ELECT REM PT RETURN 9FT ADLT (ELECTROSURGICAL) ×3
ELECTRODE REM PT RTRN 9FT ADLT (ELECTROSURGICAL) ×1 IMPLANT
GLOVE BIO SURGEON STRL SZ 6.5 (GLOVE) ×4 IMPLANT
GLOVE BIO SURGEONS STRL SZ 6.5 (GLOVE) ×2
GLOVE BIOGEL PI IND STRL 7.0 (GLOVE) ×1 IMPLANT
GLOVE BIOGEL PI INDICATOR 7.0 (GLOVE) ×2
GOWN STRL REUS W/TWL LRG LVL3 (GOWN DISPOSABLE) ×6 IMPLANT
NEEDLE HYPO 22GX1.5 SAFETY (NEEDLE) ×2 IMPLANT
NS IRRIG 1000ML POUR BTL (IV SOLUTION) ×3 IMPLANT
PACK ABDOMINAL MINOR (CUSTOM PROCEDURE TRAY) ×3 IMPLANT
PENCIL BUTTON HOLSTER BLD 10FT (ELECTRODE) IMPLANT
PROTECTOR NERVE ULNAR (MISCELLANEOUS) ×3 IMPLANT
SPONGE LAP 4X18 X RAY DECT (DISPOSABLE) ×3 IMPLANT
STRIP CLOSURE SKIN 1/2X4 (GAUZE/BANDAGES/DRESSINGS) ×2 IMPLANT
SUT PLAIN 0 NONE (SUTURE) ×3 IMPLANT
SUT VIC AB 0 CT1 27 (SUTURE) ×3
SUT VIC AB 0 CT1 27XBRD ANBCTR (SUTURE) ×1 IMPLANT
SUT VIC AB 3-0 FS2 27 (SUTURE) ×3 IMPLANT
SYR CONTROL 10ML LL (SYRINGE) ×2 IMPLANT
TOWEL OR 17X24 6PK STRL BLUE (TOWEL DISPOSABLE) ×6 IMPLANT
TRAY FOLEY CATH SILVER 14FR (SET/KITS/TRAYS/PACK) ×3 IMPLANT

## 2017-02-27 NOTE — Anesthesia Preprocedure Evaluation (Signed)
Anesthesia Evaluation  Patient identified by MRN, date of birth, ID band Patient awake    Reviewed: Allergy & Precautions, Patient's Chart, lab work & pertinent test results  Airway Mallampati: II       Dental   Pulmonary Current Smoker,    Pulmonary exam normal        Cardiovascular Normal cardiovascular exam+ dysrhythmias (Hx SVT s/p ablation)      Neuro/Psych  Headaches,    GI/Hepatic negative GI ROS, Neg liver ROS,   Endo/Other  negative endocrine ROS  Renal/GU negative Renal ROS     Musculoskeletal   Abdominal   Peds  Hematology negative hematology ROS (+)   Anesthesia Other Findings   Reproductive/Obstetrics (+) Pregnancy                             Lab Results  Component Value Date   WBC 8.1 02/27/2017   HGB 12.1 02/27/2017   HCT 34.9 (L) 02/27/2017   MCV 84.9 02/27/2017   PLT 194 02/27/2017    Anesthesia Physical Anesthesia Plan  ASA: II  Anesthesia Plan: Epidural   Post-op Pain Management:    Induction:   Airway Management Planned: Natural Airway  Additional Equipment:   Intra-op Plan:   Post-operative Plan:   Informed Consent: I have reviewed the patients History and Physical, chart, labs and discussed the procedure including the risks, benefits and alternatives for the proposed anesthesia with the patient or authorized representative who has indicated his/her understanding and acceptance.     Plan Discussed with:   Anesthesia Plan Comments:         Anesthesia Quick Evaluation

## 2017-02-27 NOTE — Progress Notes (Signed)
Patient ID: Kathy RoesKelly D Kemmer, female   DOB: May 03, 1981, 36 y.o.   MRN: [id number]   Comfortable with epidural  AFVSS gen NAD FHTs 120-130, mod var, category 1, scalp stim toco Q 3-165min  AROM for clear fluid, w/o diff/comp - copious fluid  SVE 4.5/90/-0  Continue IOL, plan for SVD Pt desires PP BTL

## 2017-02-27 NOTE — Anesthesia Procedure Notes (Signed)
Epidural Patient location during procedure: OB Start time: 02/27/2017 8:40 AM End time: 02/27/2017 8:47 AM  Staffing Anesthesiologist: Marcene DuosFITZGERALD, Monzerrath Mcburney Performed: anesthesiologist   Preanesthetic Checklist Completed: patient identified, site marked, surgical consent, pre-op evaluation, timeout performed, IV checked, risks and benefits discussed and monitors and equipment checked  Epidural Patient position: sitting Prep: site prepped and draped and DuraPrep Patient monitoring: continuous pulse ox and blood pressure Approach: midline Location: L4-L5 Injection technique: LOR air  Needle:  Needle type: Tuohy  Needle gauge: 17 G Needle length: 9 cm and 9 Needle insertion depth: 4 cm Catheter type: closed end flexible Catheter size: 19 Gauge Catheter at skin depth: 10.5 cm Test dose: negative  Assessment Events: blood not aspirated, injection not painful, no injection resistance, negative IV test and no paresthesia

## 2017-02-27 NOTE — Anesthesia Preprocedure Evaluation (Signed)
Anesthesia Evaluation  Patient identified by MRN, date of birth, ID band Patient awake    Reviewed: Allergy & Precautions, NPO status , Patient's Chart, lab work & pertinent test results  Airway Mallampati: II       Dental   Pulmonary Current Smoker,    Pulmonary exam normal        Cardiovascular Normal cardiovascular exam+ dysrhythmias (Hx SVT s/p ablation)      Neuro/Psych  Headaches,    GI/Hepatic negative GI ROS, Neg liver ROS,   Endo/Other  negative endocrine ROS  Renal/GU negative Renal ROS     Musculoskeletal   Abdominal   Peds  Hematology negative hematology ROS (+)   Anesthesia Other Findings   Reproductive/Obstetrics For post-delivery tubal ligation                             Lab Results  Component Value Date   WBC 8.1 02/27/2017   HGB 12.1 02/27/2017   HCT 34.9 (L) 02/27/2017   MCV 84.9 02/27/2017   PLT 194 02/27/2017    Anesthesia Physical  Anesthesia Plan  ASA: II  Anesthesia Plan: Epidural   Post-op Pain Management:    Induction:   Airway Management Planned: Natural Airway  Additional Equipment:   Intra-op Plan:   Post-operative Plan:   Informed Consent: I have reviewed the patients History and Physical, chart, labs and discussed the procedure including the risks, benefits and alternatives for the proposed anesthesia with the patient or authorized representative who has indicated his/her understanding and acceptance.     Plan Discussed with:   Anesthesia Plan Comments:         Anesthesia Quick Evaluation

## 2017-02-27 NOTE — Anesthesia Postprocedure Evaluation (Signed)
Anesthesia Post Note  Patient: Kathy [name]  Procedure(s) Performed: Procedure(s) (LRB): POST PARTUM TUBAL LIGATION (Bilateral)  Patient location during evaluation: PACU Anesthesia Type: Epidural Level of consciousness: awake and alert Pain management: pain level controlled Vital Signs Assessment: post-procedure vital signs reviewed and stable Respiratory status: spontaneous breathing, nonlabored ventilation, respiratory function stable and patient connected to nasal cannula oxygen Cardiovascular status: blood pressure returned to baseline and stable Postop Assessment: no signs of nausea or vomiting Anesthetic complications: no        Last Vitals:  Vitals:   02/27/17 1738 02/27/17 1745  BP: 122/77 116/71  Pulse: (!) 103 (!) 101  Resp: 16 19  Temp: 36.7 C 36.7 C    Last Pain:  Vitals:   02/27/17 1738  TempSrc:   PainSc: 8    Pain Goal: Patients Stated Pain Goal: 4 (02/27/17 1738)               Kennieth RadFitzgerald, Jullie Arps E

## 2017-02-27 NOTE — Anesthesia Procedure Notes (Signed)
Procedure Name: Intubation Date/Time: 02/27/2017 5:00 PM Performed by: Elbert EwingsHYMER, Mykaela Arena S Pre-anesthesia Checklist: Patient identified, Emergency Drugs available, Suction available and Patient being monitored Patient Re-evaluated:Patient Re-evaluated prior to inductionOxygen Delivery Method: Circle system utilized Preoxygenation: Pre-oxygenation with 100% oxygen Intubation Type: IV induction, Rapid sequence and Cricoid Pressure applied Laryngoscope Size: Glidescope Grade View: Grade I Tube type: Oral Tube size: 7.0 mm Number of attempts: 1 Airway Equipment and Method: Stylet Placement Confirmation: ETT inserted through vocal cords under direct vision,  positive ETCO2 and breath sounds checked- equal and bilateral Secured at: 21 cm Tube secured with: Tape Dental Injury: Teeth and Oropharynx as per pre-operative assessment

## 2017-02-27 NOTE — Anesthesia Postprocedure Evaluation (Signed)
Anesthesia Post Note  Patient: Kathy [name]  Procedure(s) Performed: * No procedures listed *  Patient location during evaluation: Mother Baby Anesthesia Type: Epidural Level of consciousness: oriented and awake and alert Pain management: pain level controlled Vital Signs Assessment: post-procedure vital signs reviewed and stable Respiratory status: spontaneous breathing and nonlabored ventilation Cardiovascular status: stable Postop Assessment: patient able to bend at knees, no headache, no backache, no signs of nausea or vomiting, epidural receding and adequate PO intake Anesthetic complications: no        Last Vitals:  Vitals:   02/27/17 1830 02/27/17 1848  BP: 117/70 117/72  Pulse: 88 64  Resp: 18 18  Temp: 36.6 C 36.3 C    Last Pain:  Vitals:   02/27/17 1848  TempSrc: Axillary  PainSc: 3    Pain Goal: Patients Stated Pain Goal: 4 (02/27/17 1848)               Laban EmperorMalinova,Chloe Baig Hristova

## 2017-02-27 NOTE — Anesthesia Pain Management Evaluation Note (Signed)
  [facility] Pain Management Visit Note  Patient: Kathy [name], 36 y.o., female  "Hello I am a [facility] the anesthesia [facility]. We have an anesthesia team available at all times to provide care throughout the hospital, including epidural management and anesthesia for C-section. I don't know your plan for the delivery whether it a natural birth, water birth, IV sedation, nitrous supplementation, doula or epidural, but we want to meet your pain goals."   1.Was your pain managed to your expectations on prior hospitalizations?   Yes   2.What is your expectation for pain management during this hospitalization?     Epidural  3.How can we help you reach that goal? epidural  Record the patient's initial score and the patient's pain goal.   Pain: 2  Pain Goal: 4 [facility] wants you to be able to say your pain was always managed very well.  Kathy [name],Kathy [name] 02/27/2017

## 2017-02-27 NOTE — Lactation Note (Signed)
This note was copied from a baby's chart. Lactation Consultation Note  Patient Name: Kathy [name] HeadingKelly Tye YQMVH'QToday's Date: 02/27/2017 Reason for consult: Initial assessment   Initial assessment with first time BF mom [facility] < [facility]. Mom reports her 89 yo was in NICU and she pumped and bottle fed for 2 months. She reports he did not latch and she had difficulty keeping up with his needs.   Infant STS with mom and cueing to feed. Assisted mom in latching infant to left breast in the cross cradle hold. Mom with small firm breasts with large (diameter and length)  everted nipples. Mom reports no breast changes with pregnancy. Showed mom how to hand express and large gtts colostrum easily expressible. Infant latched on and was noted to have flanged lips, rhythmic suckling and intermittent swallows. Cautioned mom to ensure infant with wide open mouth with latch and to make sure infant takes nipple and areola in with latch to decrease trauma and maximize milk transfer. Mom was able to determine infant was shallowly latched and relatched infant. Enc mom to feed infant 8-12 x in 24 hours at first feeding cues. Feeding log given with instructions for use.   Discussed BF Basics, Supply and demand, milk coming to volume, colostrum, infant stomach size, hand expression and cluster feeding. BF Resources Handout and LC Brochure given, mom informed of IP/OP Services, BF Support Groups and LC phone #. Enc mom to call out for feeding assistance as needed. [facility] and is aware to call and make appt post d/c.    Maternal Data Formula Feeding for Exclusion: No Has patient been taught Hand Expression?: Yes Does the patient have breastfeeding experience prior to this delivery?: Yes  Feeding Feeding Type: Breast Fed Length of feed: 10 min (still feeding when I left the room)  LATCH Score/Interventions Latch: Grasps breast easily, tongue down, lips flanged, rhythmical sucking.  Audible  Swallowing: A few with stimulation Intervention(s): Skin to skin;Hand expression;Alternate breast massage  Type of Nipple: Everted at rest and after stimulation (Large nipples)  Comfort (Breast/Nipple): Soft / non-tender     Hold (Positioning): Assistance needed to correctly position infant at breast and maintain latch. Intervention(s): Breastfeeding basics reviewed;Support Pillows;Position options;Skin to skin  LATCH Score: 8  Lactation Tools Discussed/Used WIC Program: Yes   Consult Status Consult Status: Follow-up Date: 02/28/17 Follow-up type: In-patient    Silas FloodSharon S Jahni Paul 02/27/2017, 2:46 PM

## 2017-02-27 NOTE — Brief Op Note (Addendum)
02/27/2017  5:22 PM  PATIENT:  Kathy [name]  36 y.o. female  PRE-OPERATIVE DIAGNOSIS:  Desires Sterilization  POST-OPERATIVE DIAGNOSIS:  Desires Sterilization  PROCEDURE:  Procedure(s): POST PARTUM TUBAL LIGATION (Bilateral) distal salpingectomy  SURGEON:  Surgeon(s) and Role:    * Sherian ReinJody Bovard-Stuckert, MD - Primary  ANESTHESIA:   epidural and general and local anesthesia with .25% marcaine  EBL:  Total I/O In: 400 [I.V.:400] Out:  [Urine:500; Blood:20}  BLOOD ADMINISTERED:none  DRAINS: Urinary Catheter (Foley), d/c in PACU   LOCAL MEDICATIONS USED:  MARCAINE    and Amount: 10 ml  SPECIMEN:  Source of Specimen:  B tubal segments  DISPOSITION OF SPECIMEN:  PATHOLOGY  COUNTS:  YES  TOURNIQUET:  * No tourniquets in log *  DICTATION: .Other Dictation: Dictation Number [id number]  PLAN OF CARE: not applicable(PP)  PATIENT DISPOSITION:  PACU - hemodynamically stable.   Delay start of Pharmacological VTE agent (>24hrs) due to surgical blood loss or risk of bleeding: not applicable

## 2017-02-27 NOTE — Transfer of Care (Signed)
Immediate Anesthesia Transfer of Care Note  Patient: Kathy [name]  Procedure(s) Performed: Procedure(s): POST PARTUM TUBAL LIGATION (Bilateral)  Patient Location: PACU  Anesthesia Type:General, epidural  Level of Consciousness: awake, alert  and oriented  Airway & Oxygen Therapy: Patient Spontanous Breathing and Patient connected to nasal cannula oxygen  Post-op Assessment: Report given to RN and Post -op Vital signs reviewed and stable BP 122/77, HR 108, RR20, Sao2 100%  Post vital signs: Reviewed and stable  Last Vitals:  Vitals:   02/27/17 1511 02/27/17 1616  BP:  115/64  Pulse:  63  Resp: 18   Temp:      Last Pain:  Vitals:   02/27/17 1613  TempSrc:   PainSc: 6       Patients Stated Pain Goal: 5 (02/27/17 0739)  Complications: No apparent anesthesia complications

## 2017-02-27 NOTE — Op Note (Signed)
NAMDalia Heading:  Eichenberger, Versa                  ACCOUNT NO.:  [id number]  MEDICAL RECORD NO.:  [id number]  LOCATION:                                 FACILITY:  PHYSICIAN:  Sherron MondayJody Bovard, MD        DATE OF BIRTH:  [birth year]  DATE OF PROCEDURE:  02/27/2017 DATE OF DISCHARGE:  02/28/2017                              OPERATIVE REPORT   PREOPERATIVE DIAGNOSIS:  Status post term vaginal delivery with undesired fertility.  POSTOPERATIVE DIAGNOSIS:  Status post term vaginal delivery with undesired fertility, status post bilateral tubal ligation.  PROCEDURE:  Postpartum bilateral tubal ligation via distal salpingectomy.  SURGEON:  Sherron MondayBovard, Lanesha Azzaro MD.  ANESTHESIA:  Epidural as well as general and 10 mL of 0.25% Marcaine for local anesthetic.  ESTIMATED BLOOD LOSS:  Approximately 20 mL.  URINE OUTPUT:  500 mL.  IV FLUIDS:  400 mL.  COMPLICATIONS:  After anesthesia induced none.  PATHOLOGY:  Bilateral distal tubal segments to pathology.  DESCRIPTION OF PROCEDURE:  After informed consent was reviewed with the patient including risks, benefits, and alternatives of surgical procedure as well as risk of ectopic pregnancy, she was transported to the operating room where she was placed in supine position on the bed. Her epidural was attempted to be dosed and she was still sensitive to touch.  After an appropriate time of waiting, then general was then induced.  Local anesthesia had also previously been instilled. Approximately 2 cm infraumbilical incision was made, carried through to the underlying fascia, elevated with Kocher clamps and incised.  The incision was extended superiorly and inferiorly and her uterus as she was recently postpartum was at this level.  The tubes were easily identified.  First on the left, followed out to the fimbriated end.  The fimbria was elevated and a Tresa EndoKelly was used to delineate the tube.  It was excised and then doubly ligated with plain gut.  This was noted to  be hemostatic.  This procedure was performed on the right.  The tube was easily identified, followed out to the fimbriated end, isolated with a Saryna clamp, doubly ligated with plain gut after the tube was excised and again was hemostatic.  The fascia was reapproximated with 0 Vicryl in a running fashion.  The subcuticular tissue was closed with a figure-of-eight suture and the skin was closed with 4-0 Vicryl in a running fashion. Benzoin and Steri-Strips were applied.  The patient tolerated the procedure well.  Sponge, lap, and needle counts were correct x2 per the operating staff.     Sherron MondayJody Bovard, MD   ______________________________ Sherron MondayJody Bovard, MD    JB/MEDQ  D:  02/27/2017  T:  02/27/2017  Job:  [id number]

## 2017-02-27 NOTE — Op Note (Deleted)
  The note originally documented on this encounter has been moved the the encounter in which it belongs.  

## 2017-02-28 ENCOUNTER — Encounter (HOSPITAL_COMMUNITY): Payer: Self-pay | Admitting: *Deleted

## 2017-02-28 LAB — CBC
HEMATOCRIT: 28 % — AB (ref 36.0–46.0)
Hemoglobin: 9.7 g/dL — ABNORMAL LOW (ref 12.0–15.0)
MCH: 29.7 pg (ref 26.0–34.0)
MCHC: 34.6 g/dL (ref 30.0–36.0)
MCV: 85.6 fL (ref 78.0–100.0)
PLATELETS: 142 10*3/uL — AB (ref 150–400)
RBC: 3.27 MIL/uL — ABNORMAL LOW (ref 3.87–5.11)
RDW: 13.9 % (ref 11.5–15.5)
WBC: 10.2 10*3/uL (ref 4.0–10.5)

## 2017-02-28 LAB — RPR: RPR Ser Ql: NONREACTIVE

## 2017-02-28 MED ORDER — IBUPROFEN 600 MG PO TABS: 600.0000 mg | ORAL_TABLET | Freq: Four times a day (QID) | ORAL | 0 refills | Status: DC

## 2017-02-28 MED ORDER — OXYCODONE HCL 5 MG PO TABS: 5.0000 mg | ORAL_TABLET | ORAL | 0 refills | Status: DC | PRN

## 2017-02-28 NOTE — Progress Notes (Signed)
Post Partum Day 1/ POD 1 ppBTL Subjective: up ad lib, voiding and tolerating PO  States she is sore and tired, but would like early d/c if able because thinks can rest better at home  Objective: Blood pressure (!) 96/44, pulse (!) 52, temperature 98 F (36.7 C), temperature source Oral, resp. rate 20, height 5\' 5"  (1.651 m), weight 59.9 kg (132 lb), SpO2 100 %, unknown if currently breastfeeding.  Physical Exam:  General: alert and cooperative Lochia: appropriate Uterine Fundus: firm Incision: C/D/I     Recent Labs  02/27/17 0745 02/28/17 0652  HGB 12.1 9.7*  HCT 34.9* 28.0*    Assessment/Plan: Discharge home if baby able to go ppBTL site look good   LOS: 1 day   Cederick Broadnax W 02/28/2017, 9:13 AM

## 2017-02-28 NOTE — Discharge Summary (Signed)
OB Discharge Summary     Patient Name: Kathy [name] DOB: 04-Feb-1981 MRN: [id number]  Date of admission: 02/27/2017 Delivering MD: Sherian Rein   Date of discharge: 02/28/2017  Admitting diagnosis: INDUCTION Desires Sterilization Intrauterine pregnancy: [redacted]w[redacted]d     Secondary diagnosis:  Principal Problem:   SVD (spontaneous vaginal delivery) Active Problems:   Normal pregnancy in multigravida in third trimester   S/P tubal ligation  Additional problems: none     Discharge diagnosis: Term Pregnancy Delivered                                                                                                Post partum procedures:postpartum tubal ligation  Augmentation: AROM and Pitocin  Complications: None  Hospital course:  Induction of Labor With Vaginal Delivery   36 y.o. yo G2P1001 at [redacted]w[redacted]d was admitted to the hospital 02/27/2017 for induction of labor.  Indication for induction: Favorable cervix at term.  Patient had an uncomplicated labor course as follows: Membrane Rupture Time/Date: 9:42 AM ,02/27/2017   Intrapartum Procedures: Episiotomy: None [1]                                         Lacerations:  2nd degree [3]  Patient had delivery of a Viable infant.  Information for the patient's newborn:  Marijose, [place]uring[place] [[id number]]      02/27/2017  Details of delivery can be found in separate delivery note.  Patient had a routine postpartum course. Patient is discharged home 02/28/17.  Physical exam  Vitals:   02/27/17 2010 02/28/17 0000 02/28/17 0445 02/28/17 0826  BP: 111/63 101/61 (!) 93/48 (!) 96/44  Pulse: (!) 55 (!) 59 (!) 52 (!) 52  Resp: 18 18 18 20   Temp: 98.2 F (36.8 C) 98 F (36.7 C) 98.4 F (36.9 C) 98 F (36.7 C)  TempSrc: Oral Oral Oral Oral  SpO2:      Weight:      Height:       General: alert and cooperative Lochia: appropriate Uterine Fundus: firm Incision: Dressing is clean, dry, and intact  Labs: Lab Results  Component Value Date   WBC 10.2 02/28/2017   HGB 9.7 (L) 02/28/2017   HCT 28.0 (L) 02/28/2017   MCV 85.6 02/28/2017   PLT 142 (L) 02/28/2017   CMP Latest Ref Rng & Units 07/29/2015  Glucose 65 - 99 mg/dL 87  BUN 6 - 20 mg/dL 8  Creatinine 7.82 - 9.56 mg/dL 2.13  Sodium 086 - 578 mmol/L 141  Potassium 3.5 - 5.1 mmol/L 3.7  Chloride 101 - 111 mmol/L 110  CO2 22 - 32 mmol/L 24  Calcium 8.9 - 10.3 mg/dL 4.6(N)  Total Protein 6.5 - 8.1 g/dL 6.9  Total Bilirubin 0.3 - 1.2 mg/dL 0.9  Alkaline Phos 38 - 126 U/L 59  AST 15 - 41 U/L 17  ALT 14 - 54 U/L 13(L)    Discharge instruction: per After Visit Summary and "Baby and Me Booklet".  After visit  meds:  Allergies as of 02/28/2017      Reactions   Coconut Oil Swelling   Tongue and lips   Codeine Itching   "Keeps me Wide awake"   Latex    Tomato Swelling   Tongue and lips   Hydrocodone Itching   "keeps me wide awake"      Medication List    STOP taking these medications   terconazole 0.4 % vaginal cream Commonly known as:  TERAZOL 7     TAKE these medications   ibuprofen 600 MG tablet Commonly known as:  ADVIL,MOTRIN Take 1 tablet (600 mg total) by mouth every 6 (six) hours.   MELATONIN GUMMIES PO Take 5 mg by mouth at bedtime as needed (sleep).   oxyCODONE 5 MG immediate release tablet Commonly known as:  Oxy IR/ROXICODONE Take 1 tablet (5 mg total) by mouth every 4 (four) hours as needed (pain scale 4-7).   prenatal multivitamin Tabs tablet Take 1 tablet by mouth daily at 12 noon.       Diet: routine diet  Activity: Advance as tolerated. Pelvic rest for 6 weeks.   Outpatient follow up:2 weeks Follow up Appt:No future appointments. Follow up Visit:No Follow-up on file.  Postpartum contraception: Tubal Ligation  Newborn Data: Live born female  Birth Weight: 7 lb 5.6 oz (3334 g) APGAR: 9, 9  Baby Feeding: Breast Disposition:home with mother   02/28/2017 Oliver PilaICHARDSON,Vann Okerlund W, MD

## 2017-03-01 ENCOUNTER — Encounter (HOSPITAL_COMMUNITY): Payer: Self-pay | Admitting: Obstetrics and Gynecology

## 2017-05-07 IMAGING — CT CT HEAD W/O CM
2 series · 16 of 30 positions shown, 20 images · non-contrast
Comparison: None.

CLINICAL DATA: Near syncopal episode.

EXAM:
CT HEAD WITHOUT CONTRAST
TECHNIQUE: Contiguous axial images were obtained from the base of the skull
through the vertex without intravenous contrast.

[Series 2: head w/o · axial · non-contrast · 0.40mm/px · z∈[-205,-85]mm · 13 of 29 slices shown, 17 images]
[im 3/29  brain]
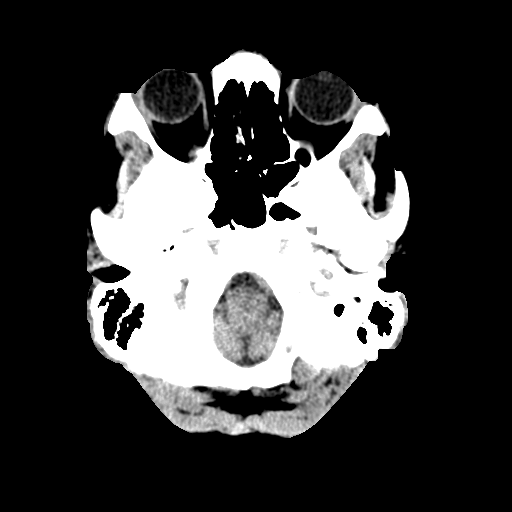
[im 3/29  bone]
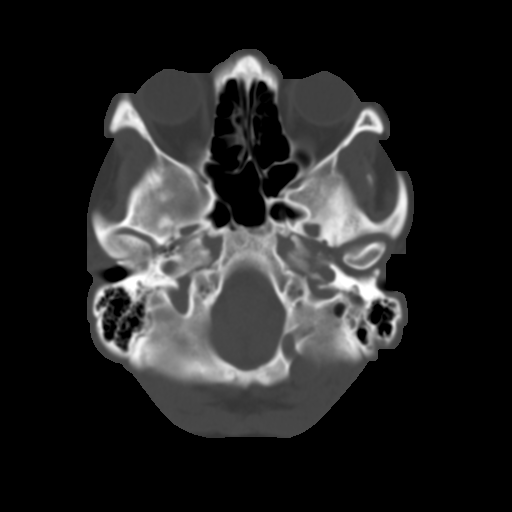
[im 5/29  brain]
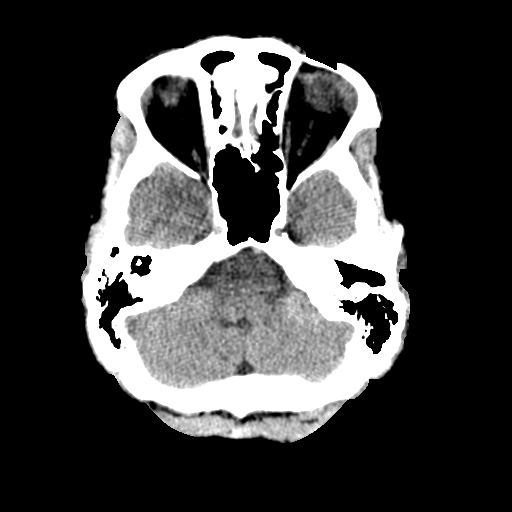
[im 7/29  brain]
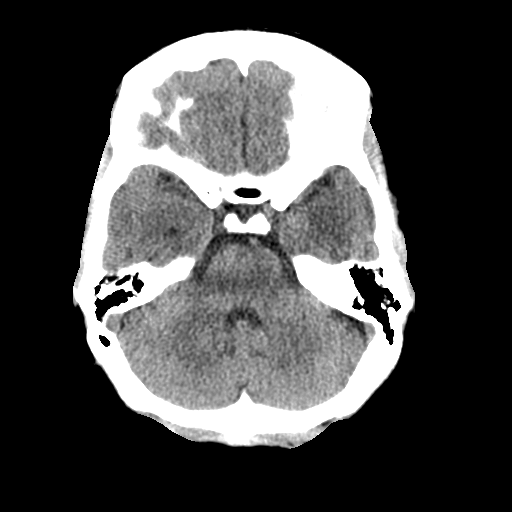
[im 9/29  brain]
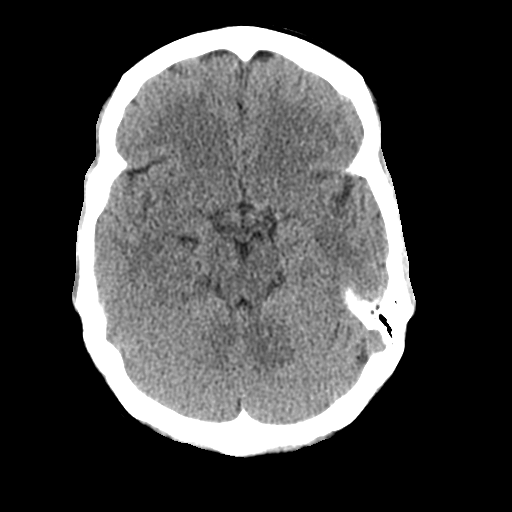
[im 11/29  brain]
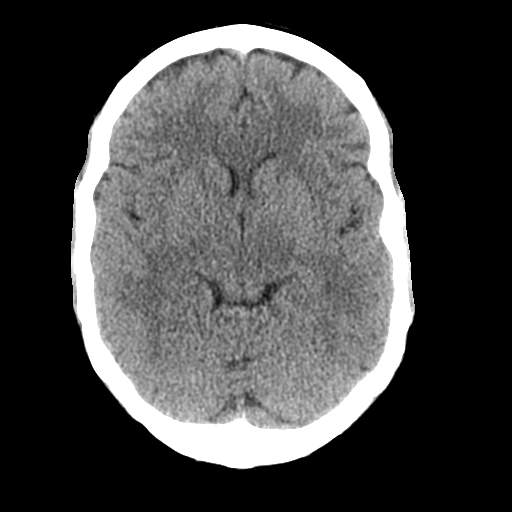
[im 11/29  bone]
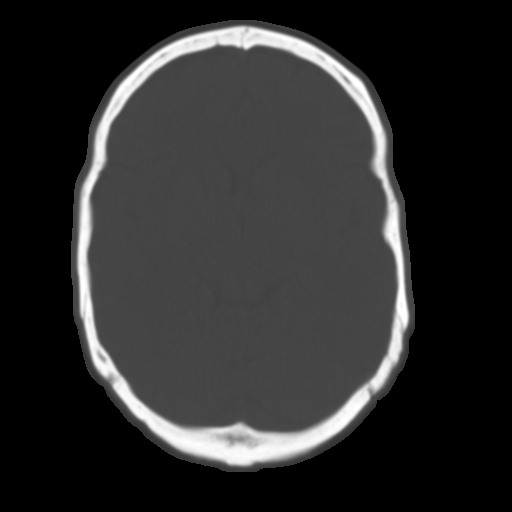
[im 13/29  brain]
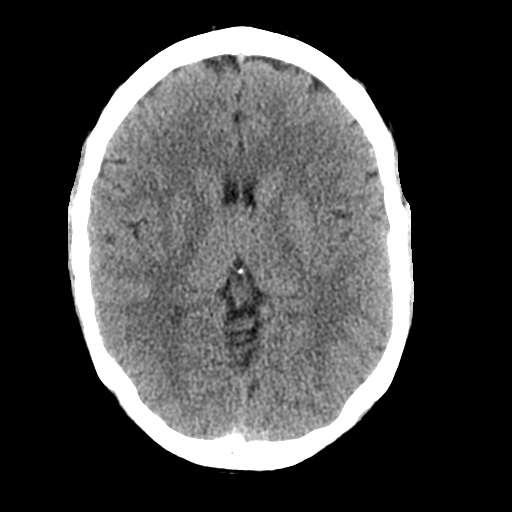
[im 15/29  brain]
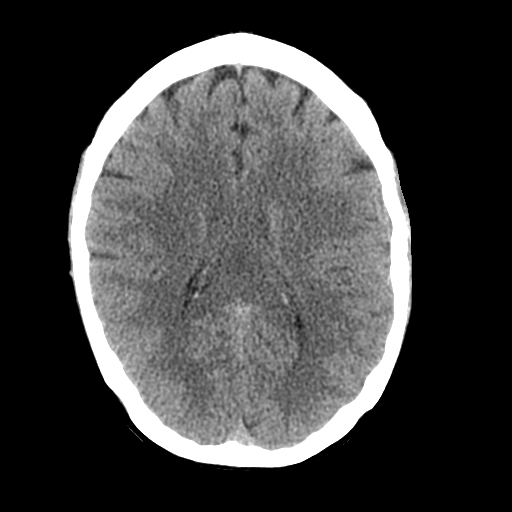
[im 17/29  brain]
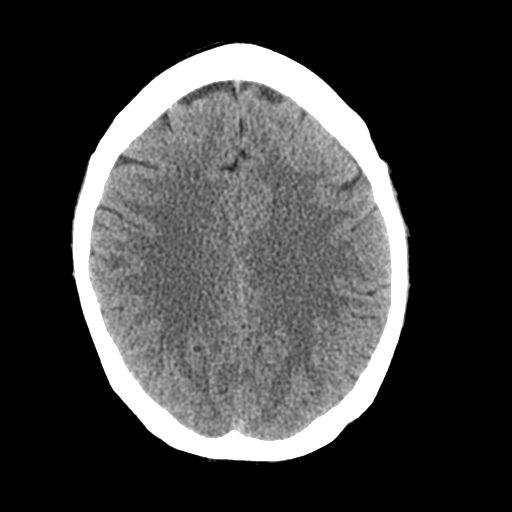
[im 19/29  brain]
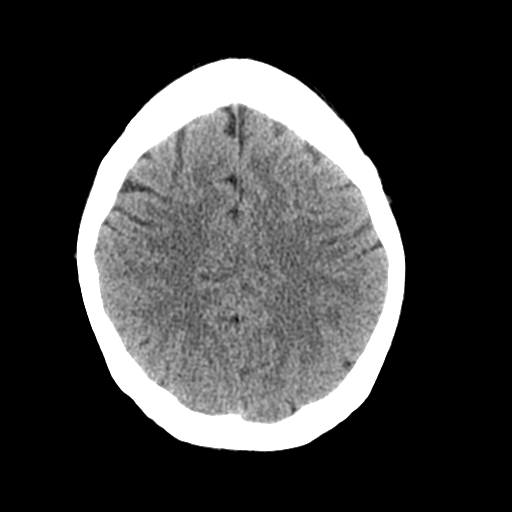
[im 19/29  bone]
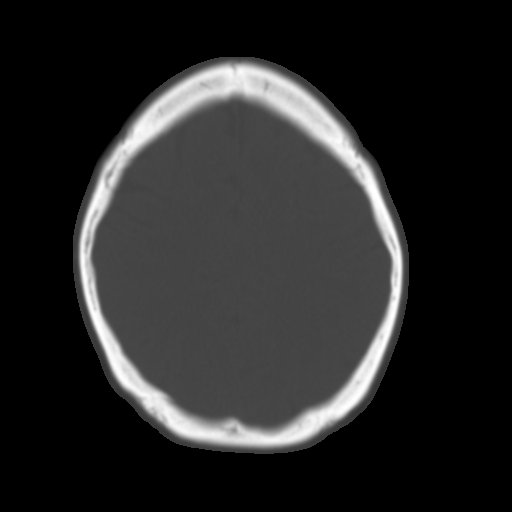
[im 21/29  brain]
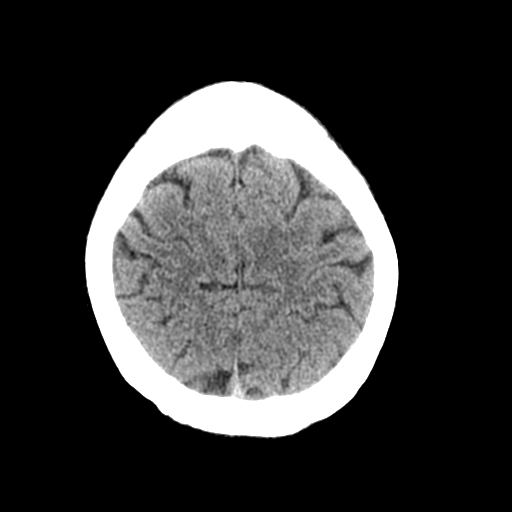
[im 23/29  brain]
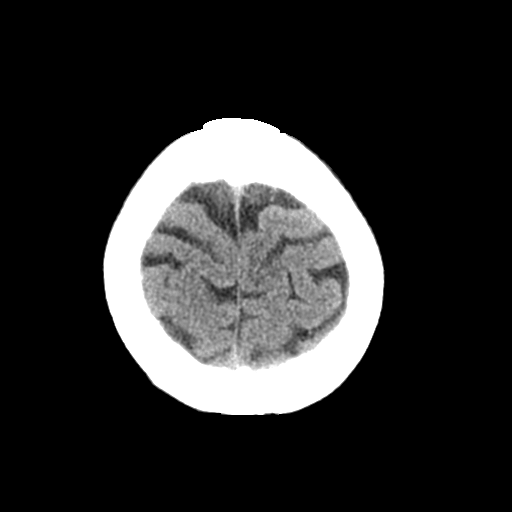
[im 25/29  brain]
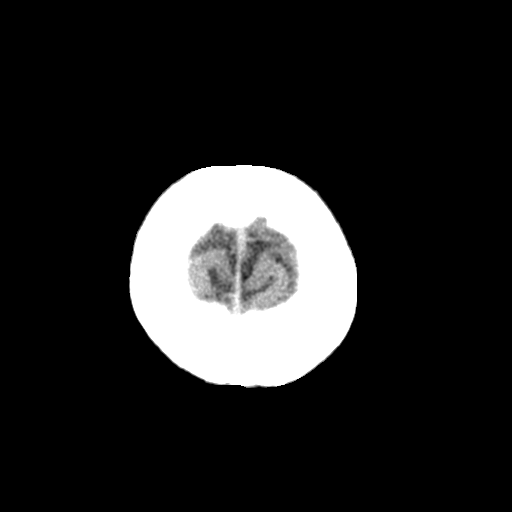
[im 27/29  brain]
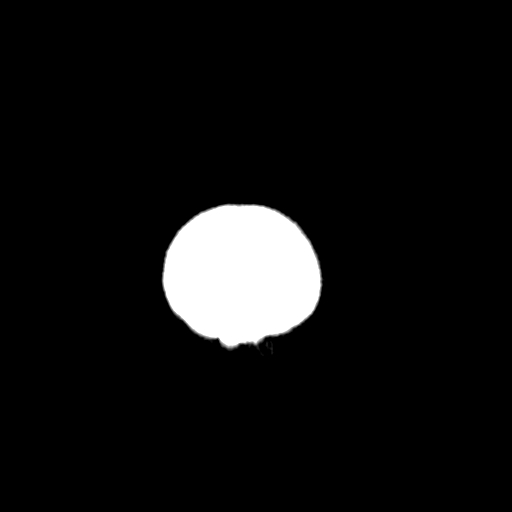
[im 27/29  bone]
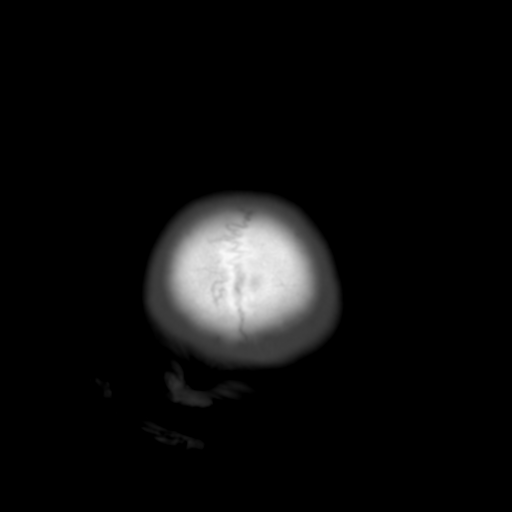

[Series 3: bone windows · axial · 0.40mm/px · z∈[-205,-165]mm · 3 of 29 slices shown]
[im 3/29  bone]
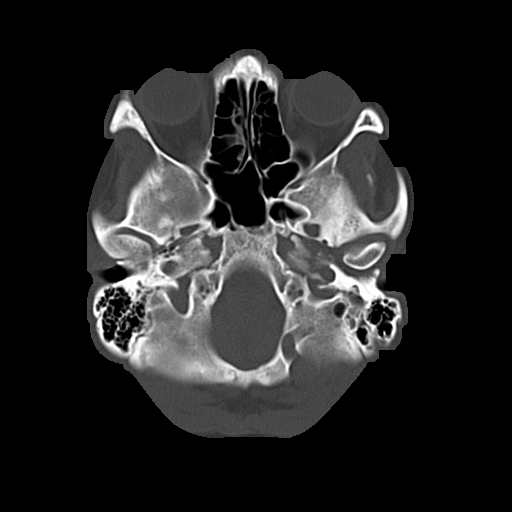
[im 7/29  bone]
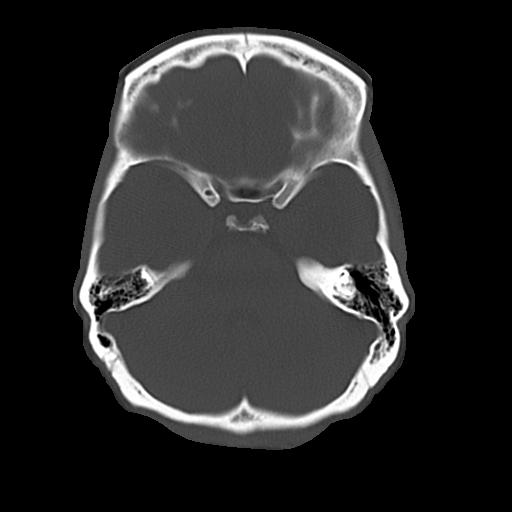
[im 11/29  bone]
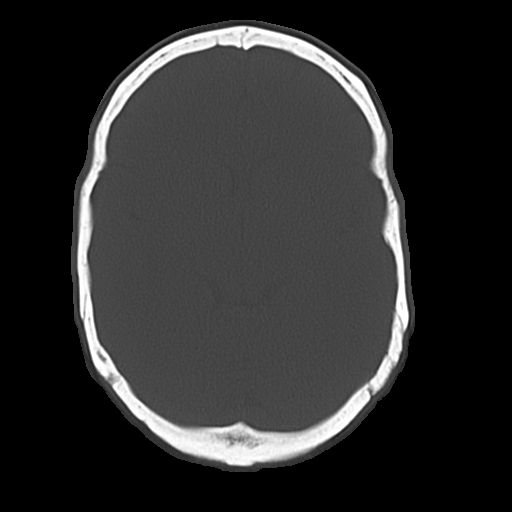

[16 of 30 positions shown; findings below may reference images not displayed]

FINDINGS: Ventricles are normal in size and configuration. All areas of the
brain demonstrate normal gray-white matter differentiation. There is
no mass, hemorrhage, edema, or other evidence of acute parenchymal
abnormality. No extra-axial hemorrhage. No osseous abnormality.
Visualized upper paranasal sinuses are clear. Mastoid air cells are
clear.
IMPRESSION: Normal head CT.

## 2018-10-28 ENCOUNTER — Emergency Department (HOSPITAL_COMMUNITY)
Admission: EM | Admit: 2018-10-28 | Discharge: 2018-10-28 | Disposition: A | Discharge status: Home / Self Care | Payer: Self-pay | Attending: Emergency Medicine | Admitting: Emergency Medicine

## 2018-10-28 ENCOUNTER — Other Ambulatory Visit: Payer: Self-pay

## 2018-10-28 ENCOUNTER — Encounter (HOSPITAL_COMMUNITY): Payer: Self-pay | Admitting: *Deleted

## 2018-10-28 DIAGNOSIS — Z79899 Other long term (current) drug therapy: Secondary | ICD-10-CM | POA: Insufficient documentation

## 2018-10-28 DIAGNOSIS — F17210 Nicotine dependence, cigarettes, uncomplicated: Secondary | ICD-10-CM | POA: Insufficient documentation

## 2018-10-28 DIAGNOSIS — K029 Dental caries, unspecified: Secondary | ICD-10-CM | POA: Insufficient documentation

## 2018-10-28 DIAGNOSIS — K0889 Other specified disorders of teeth and supporting structures: Secondary | ICD-10-CM

## 2018-10-28 MED ORDER — PENICILLIN V POTASSIUM 500 MG PO TABS: 500.0000 mg | ORAL_TABLET | Freq: Three times a day (TID) | ORAL | 0 refills | Status: DC

## 2018-10-28 MED ORDER — NAPROXEN 375 MG PO TABS: 375.0000 mg | ORAL_TABLET | Freq: Two times a day (BID) | ORAL | 0 refills | Status: DC

## 2018-10-28 MED ORDER — PENICILLIN V POTASSIUM 500 MG PO TABS
500.0000 mg | ORAL_TABLET | Freq: Once | ORAL | Status: AC
  Administered 2018-10-28: 500 mg via ORAL
  Filled 2018-10-28: qty 1

## 2018-10-28 MED ORDER — NAPROXEN 375 MG PO TABS
375.0000 mg | ORAL_TABLET | Freq: Once | ORAL | Status: AC
  Administered 2018-10-28: 375 mg via ORAL
  Filled 2018-10-28: qty 1

## 2018-10-28 NOTE — ED Provider Notes (Signed)
[facility]-EMERGENCY DEPT Provider Note   CSN: [id number] Arrival date & time: 10/28/18  1478     History   Chief Complaint Chief Complaint  Patient presents with  . Dental Pain    HPI Kathy [name] is a 37 y.o. female.  HPI Patient presented to the emergency room for evaluation of dental pain.  Patient states she chronically has issues with her teeth.  She started having pain in her right lower jaw around her right lower canine teeth.  It hurts to chew.  She tried to see a Education officer, community but there is some issues with her Medicaid insurance.  Patient denies any trouble with nausea or vomiting.  No fevers or chills.  No difficulty swallowing.  She is tried over-the-counter medications without relief. Past Medical History:  Diagnosis Date  . Abnormal Pap smear    cryo/ freezing, normal pap after  . Dysrhythmia    svt  . Headache   . Normal pregnancy in multigravida in third trimester 02/26/2017  . S/P tubal ligation 02/27/2017  . SVD (spontaneous vaginal delivery) 01/11/2014  . SVD (spontaneous vaginal delivery) 02/27/2017  . Vaginal Pap smear, abnormal     Patient Active Problem List   Diagnosis Date Noted  . SVD (spontaneous vaginal delivery) 02/27/2017  . S/P tubal ligation 02/27/2017  . Normal pregnancy in multigravida in third trimester 02/26/2017  . Paroxysmal SVT (supraventricular tachycardia) ([facility]CC) 10/18/2013    Past Surgical History:  Procedure Laterality Date  . CARDIAC ELECTROPHYSIOLOGY MAPPING AND ABLATION    . GYNECOLOGIC CRYOSURGERY    . NO PAST SURGERIES    . TUBAL LIGATION Bilateral 02/27/2017   Procedure: POST PARTUM TUBAL LIGATION;  Surgeon: Sherian Rein, MD;  Location: [facility];  Service: Gynecology;  Laterality: Bilateral;     OB History    Gravida  2   Para  1   Term  1   Preterm      AB      Living  1     SAB      TAB      Ectopic      Multiple      Live Births  1            Home Medications     Prior to Admission medications   Medication Sig Start Date End Date Taking? Authorizing Provider  ibuprofen (ADVIL,MOTRIN) 600 MG tablet Take 1 tablet (600 mg total) by mouth every 6 (six) hours. 02/28/17   Huel Cote, MD  MELATONIN GUMMIES PO Take 5 mg by mouth at bedtime as needed (sleep).    [provider]  naproxen (NAPROSYN) 375 MG tablet Take 1 tablet (375 mg total) by mouth 2 (two) times daily. 10/28/18   Linwood Dibbles, MD  oxyCODONE (OXY IR/ROXICODONE) 5 MG immediate release tablet Take 1 tablet (5 mg total) by mouth every 4 (four) hours as needed (pain scale 4-7). 02/28/17   Huel Cote, MD  penicillin v potassium (VEETID) 500 MG tablet Take 1 tablet (500 mg total) by mouth 3 (three) times daily. 10/28/18   Linwood Dibbles, MD  Prenatal Vit-Fe Fumarate-FA (PRENATAL MULTIVITAMIN) TABS tablet Take 1 tablet by mouth daily at 12 noon. 01/13/14   Bovard-StuckertAugusto Gamble, MD    Family History No family history on file.  Social History Social History   Tobacco Use  . Smoking status: Current Every Day Smoker    Packs/day: 0.50    Types: Cigarettes  . Smokeless tobacco: Never Used  Substance Use Topics  . Alcohol use: No  . Drug use: No     Allergies   Coconut oil; Codeine; Latex; Tomato; and Hydrocodone   Review of Systems Review of Systems  All other systems reviewed and are negative.    Physical Exam Updated Vital Signs BP 123/80 (BP Location: Left Arm)   Pulse 70   Temp 98.5 F (36.9 C) (Oral)   Resp 18   SpO2 100%   Physical Exam  Constitutional: She appears well-developed and well-nourished. No distress.  HENT:  Head: Normocephalic and atraumatic.  Right Ear: External ear normal.  Left Ear: External ear normal.  Mouth/Throat: Dental caries present.  Poor dentition, absent teeth, plaque buildup, evidence of swelling or purulent drainage  Eyes: Conjunctivae are normal. Right eye exhibits no discharge. Left eye exhibits no discharge. No scleral  icterus.  Neck: Normal range of motion. Neck supple. No tracheal deviation present.  No soft tissue swelling  Cardiovascular: Normal rate.  Pulmonary/Chest: Effort normal. No stridor. No respiratory distress.  Abdominal: She exhibits no distension.  Musculoskeletal: She exhibits no edema.  Lymphadenopathy:    She has no cervical adenopathy.  Neurological: She is alert. Cranial nerve deficit: no gross deficits.  Skin: Skin is warm and dry. No rash noted.  Psychiatric: She has a normal mood and affect.  Nursing note and vitals reviewed.    ED Treatments / Results  Labs (all labs ordered are listed, but only abnormal results are displayed) Labs Reviewed - No data to display  EKG None  Radiology No results found.  Procedures Procedures (including critical care time)  Medications Ordered in ED Medications  naproxen (NAPROSYN) tablet 375 mg (has no administration in time range)  penicillin v potassium (VEETID) tablet 500 mg (has no administration in time range)     Initial Impression / Assessment and Plan / ED Course  I have reviewed the triage vital signs and the nursing notes.  Pertinent labs & imaging results that were available during my care of the patient were reviewed by me and considered in my medical decision making (see chart for details).   Patient symptoms are consistent with dental pain likely related to dental caries.  She may have a dental infection.  Plan on discharge home with antibiotics and referral to dentistry.  Final Clinical Impressions(s) / ED Diagnoses   Final diagnoses:  Pain, dental    ED Discharge Orders         Ordered    naproxen (NAPROSYN) 375 MG tablet  2 times daily     10/28/18 0750    penicillin v potassium (VEETID) 500 MG tablet  3 times daily     10/28/18 0750           Linwood Dibbles, MD 10/28/18 [phone]

## 2018-10-28 NOTE — Discharge Instructions (Signed)
Take the medications as needed for pain, follow up with the dentist, call today and let them know you were referred from the [facility] ED

## 2018-10-28 NOTE — ED Triage Notes (Signed)
Right lower frontal dental pain for several days, taking ibuprofen for pain.

## 2019-08-04 ENCOUNTER — Emergency Department (HOSPITAL_COMMUNITY)
Admission: EM | Admit: 2019-08-04 | Discharge: 2019-08-05 | Disposition: A | Discharge status: Home / Self Care | Payer: Medicaid Other | Attending: Emergency Medicine | Admitting: Emergency Medicine

## 2019-08-04 ENCOUNTER — Other Ambulatory Visit: Payer: Self-pay

## 2019-08-04 ENCOUNTER — Emergency Department (HOSPITAL_COMMUNITY): Payer: Medicaid Other

## 2019-08-04 ENCOUNTER — Encounter (HOSPITAL_COMMUNITY): Payer: Self-pay | Admitting: Emergency Medicine

## 2019-08-04 DIAGNOSIS — K047 Periapical abscess without sinus: Secondary | ICD-10-CM | POA: Insufficient documentation

## 2019-08-04 DIAGNOSIS — R1011 Right upper quadrant pain: Secondary | ICD-10-CM | POA: Insufficient documentation

## 2019-08-04 DIAGNOSIS — K0889 Other specified disorders of teeth and supporting structures: Secondary | ICD-10-CM | POA: Diagnosis present

## 2019-08-04 DIAGNOSIS — F17210 Nicotine dependence, cigarettes, uncomplicated: Secondary | ICD-10-CM | POA: Diagnosis not present

## 2019-08-04 DIAGNOSIS — K829 Disease of gallbladder, unspecified: Secondary | ICD-10-CM | POA: Insufficient documentation

## 2019-08-04 DIAGNOSIS — K828 Other specified diseases of gallbladder: Secondary | ICD-10-CM

## 2019-08-04 LAB — CBC WITH DIFFERENTIAL/PLATELET
Abs Immature Granulocytes: 0.01 10*3/uL (ref 0.00–0.07)
Basophils Absolute: 0 10*3/uL (ref 0.0–0.1)
Basophils Relative: 1 %
Eosinophils Absolute: 0.2 10*3/uL (ref 0.0–0.5)
Eosinophils Relative: 3 %
HCT: 37 % (ref 36.0–46.0)
Hemoglobin: 12.5 g/dL (ref 12.0–15.0)
Immature Granulocytes: 0 %
Lymphocytes Relative: 37 %
Lymphs Abs: 2.6 10*3/uL (ref 0.7–4.0)
MCH: 30.6 pg (ref 26.0–34.0)
MCHC: 33.8 g/dL (ref 30.0–36.0)
MCV: 90.7 fL (ref 80.0–100.0)
Monocytes Absolute: 0.6 10*3/uL (ref 0.1–1.0)
Monocytes Relative: 8 %
Neutro Abs: 3.6 10*3/uL (ref 1.7–7.7)
Neutrophils Relative %: 51 %
Platelets: 258 10*3/uL (ref 150–400)
RBC: 4.08 MIL/uL (ref 3.87–5.11)
RDW: 14 % (ref 11.5–15.5)
WBC: 7 10*3/uL (ref 4.0–10.5)
nRBC: 0 % (ref 0.0–0.2)

## 2019-08-04 MED ORDER — ONDANSETRON 4 MG PO TBDP: 4.0000 mg | ORAL_TABLET | Freq: Once | ORAL | Status: DC

## 2019-08-04 MED ORDER — LIDOCAINE VISCOUS HCL 2 % MT SOLN: 15.0000 mL | Freq: Once | OROMUCOSAL | Status: DC

## 2019-08-04 MED ORDER — FENTANYL CITRATE (PF) 100 MCG/2ML IJ SOLN
50.0000 ug | Freq: Once | INTRAMUSCULAR | Status: AC
  Administered 2019-08-04: 50 ug via INTRAVENOUS
  Filled 2019-08-04: qty 2

## 2019-08-04 MED ORDER — ALUM & MAG HYDROXIDE-SIMETH 200-200-20 MG/5ML PO SUSP: 30.0000 mL | Freq: Once | ORAL | Status: DC

## 2019-08-04 MED ORDER — ONDANSETRON HCL 4 MG/2ML IJ SOLN
4.0000 mg | Freq: Once | INTRAMUSCULAR | Status: AC
  Administered 2019-08-04: 4 mg via INTRAVENOUS
  Filled 2019-08-04: qty 2

## 2019-08-04 NOTE — ED Triage Notes (Signed)
Patient here from home with complaints of bilateral upper dental pain. Reports that she does not have insurance and unable to get tooth pulled. Pain 10/10.

## 2019-08-05 LAB — LIPASE, BLOOD: Lipase: 20 U/L (ref 11–51)

## 2019-08-05 LAB — COMPREHENSIVE METABOLIC PANEL
ALT: 31 U/L (ref 0–44)
AST: 35 U/L (ref 15–41)
Albumin: 3.7 g/dL (ref 3.5–5.0)
Alkaline Phosphatase: 71 U/L (ref 38–126)
Anion gap: 10 (ref 5–15)
BUN: 9 mg/dL (ref 6–20)
CO2: 21 mmol/L — ABNORMAL LOW (ref 22–32)
Calcium: 8.7 mg/dL — ABNORMAL LOW (ref 8.9–10.3)
Chloride: 108 mmol/L (ref 98–111)
Creatinine, Ser: 0.67 mg/dL (ref 0.44–1.00)
GFR calc Af Amer: >60 mL/min (ref >60)
GFR calc non Af Amer: >60 mL/min (ref >60)
Glucose, Bld: 89 mg/dL (ref 70–99)
Potassium: 4.5 mmol/L (ref 3.5–5.1)
Sodium: 139 mmol/L (ref 135–145)
Total Bilirubin: 1.3 mg/dL — ABNORMAL HIGH (ref 0.3–1.2)
Total Protein: 6.6 g/dL (ref 6.5–8.1)

## 2019-08-05 MED ORDER — CLINDAMYCIN HCL 150 MG PO CAPS: 300.0000 mg | ORAL_CAPSULE | Freq: Four times a day (QID) | ORAL | 0 refills | Status: DC

## 2019-08-05 MED ORDER — CLINDAMYCIN HCL 300 MG PO CAPS
300.0000 mg | ORAL_CAPSULE | Freq: Once | ORAL | Status: AC
  Administered 2019-08-05: 300 mg via ORAL
  Filled 2019-08-05: qty 1

## 2019-08-05 MED ORDER — PANTOPRAZOLE SODIUM 40 MG PO TBEC
40.0000 mg | DELAYED_RELEASE_TABLET | Freq: Once | ORAL | Status: AC
  Administered 2019-08-05: 01:00:00 40 mg via ORAL
  Filled 2019-08-05: qty 1

## 2019-08-05 MED ORDER — PANTOPRAZOLE SODIUM 20 MG PO TBEC: 20.0000 mg | DELAYED_RELEASE_TABLET | Freq: Every day | ORAL | 0 refills | Status: DC

## 2019-08-05 NOTE — ED Provider Notes (Signed)
[facility] DEPT Provider Note   CSN: [id number] Arrival date & time: 08/04/19  1913     History   Chief Complaint Chief Complaint  Patient presents with  . Dental Pain  . Abdominal Pain    HPI Kathy [name] is a 38 y.o. female.     The history is provided by the patient and medical records. No language interpreter was used.  Dental Pain Abdominal Pain Associated symptoms: no constipation, no diarrhea, no nausea and no vomiting    Kathy [name] is a 38 y.o. female  with a PMH as listed below who presents to the Emergency Department with two complaints:  1. Persistent, gradually worsening, bilateral upper dental pain beginning about a week ago. Pt describes their pain as throbbing and unbearable. Pt has been taking 4 ibuprofen about 4-5x a day at home with minimal relief of pain. They are not currently followed by dentistry due to financial issues and lack of insurance.  Pt denies facial swelling, fever, chills, difficulty breathing, difficulty swallowing.   2.  Upper abdominal pain which began yesterday.  As noted above, she has been taking a large amount of NSAIDs this past week because of her dental pain.  She denies n/v/d/c. No urinary symptoms. Denies alleviating or aggravating factors. No hx of similar. Denies concern for pregnancy s/p tubal ligation.    Past Medical History:  Diagnosis Date  . Abnormal Pap smear    cryo/ freezing, normal pap after  . Dysrhythmia    svt  . Headache   . Normal pregnancy in multigravida in third trimester 02/26/2017  . S/P tubal ligation 02/27/2017  . SVD (spontaneous vaginal delivery) 01/11/2014  . SVD (spontaneous vaginal delivery) 02/27/2017  . Vaginal Pap smear, abnormal     Patient Active Problem List   Diagnosis Date Noted  . SVD (spontaneous vaginal delivery) 02/27/2017  . S/P tubal ligation 02/27/2017  . Normal pregnancy in multigravida in third trimester 02/26/2017  . Paroxysmal SVT  (supraventricular tachycardia) ([facility]) 10/18/2013    Past Surgical History:  Procedure Laterality Date  . CARDIAC ELECTROPHYSIOLOGY MAPPING AND ABLATION    . GYNECOLOGIC CRYOSURGERY    . NO PAST SURGERIES    . TUBAL LIGATION Bilateral 02/27/2017   Procedure: POST PARTUM TUBAL LIGATION;  Surgeon: Janyth Contes, MD;  Location: [facility];  Service: Gynecology;  Laterality: Bilateral;     OB History    Gravida  2   Para  1   Term  1   Preterm      AB      Living  1     SAB      TAB      Ectopic      Multiple      Live Births  1            Home Medications    Prior to Admission medications   Medication Sig Start Date End Date Taking? Authorizing Provider  clindamycin (CLEOCIN) 150 MG capsule Take 2 capsules (300 mg total) by mouth 4 (four) times daily. 08/05/19   Jalynn Waddell, Ozella Almond, PA-C  ibuprofen (ADVIL,MOTRIN) 600 MG tablet Take 1 tablet (600 mg total) by mouth every 6 (six) hours. 02/28/17   Paula Compton, MD  MELATONIN GUMMIES PO Take 5 mg by mouth at bedtime as needed (sleep).    [provider]  naproxen (NAPROSYN) 375 MG tablet Take 1 tablet (375 mg total) by mouth 2 (two) times daily. 10/28/18  Linwood DibblesKnapp, Jon, MD  oxyCODONE (OXY IR/ROXICODONE) 5 MG immediate release tablet Take 1 tablet (5 mg total) by mouth every 4 (four) hours as needed (pain scale 4-7). 02/28/17   Huel Coteichardson, Kathy, MD  pantoprazole (PROTONIX) 20 MG tablet Take 1 tablet (20 mg total) by mouth daily. 08/05/19   Jahmya Onofrio, Chase PicketJaime Pilcher, PA-C  penicillin v potassium (VEETID) 500 MG tablet Take 1 tablet (500 mg total) by mouth 3 (three) times daily. 10/28/18   Linwood DibblesKnapp, Jon, MD  Prenatal Vit-Fe Fumarate-FA (PRENATAL MULTIVITAMIN) TABS tablet Take 1 tablet by mouth daily at 12 noon. 01/13/14   Bovard-StuckertAugusto Gamble, Jody, MD    Family History No family history on file.  Social History Social History   Tobacco Use  . Smoking status: Current Every Day Smoker    Packs/day: 0.50    Types:  Cigarettes  . Smokeless tobacco: Never Used  Substance Use Topics  . Alcohol use: No  . Drug use: No     Allergies   Coconut oil, Codeine, Latex, Tomato, and Hydrocodone   Review of Systems Review of Systems  HENT: Positive for dental problem. Negative for trouble swallowing.   Gastrointestinal: Positive for abdominal pain. Negative for blood in stool, constipation, diarrhea, nausea and vomiting.  All other systems reviewed and are negative.    Physical Exam Updated Vital Signs BP (!) 109/57 (BP Location: Left Arm)   Pulse 60   Temp 98.4 F (36.9 C) (Oral)   Resp 10   Ht 5\' 5"  (1.651 m)   Wt 47.6 kg   SpO2 100%   BMI 17.47 kg/m   Physical Exam Vitals signs and nursing note reviewed.  Constitutional:      General: She is not in acute distress.    Appearance: She is well-developed.  HENT:     Head: Normocephalic and atraumatic.     Mouth/Throat:      Comments: Dental cavities and poor oral dentition noted. Pain along teeth as depicted in image. No abscess noted. Midline uvula. No trismus. OP moist and clear. No oropharyngeal erythema or edema. Neck supple with no tenderness. No facial edema. Neck:     Musculoskeletal: Neck supple.  Cardiovascular:     Rate and Rhythm: Normal rate and regular rhythm.     Heart sounds: Normal heart sounds. No murmur.  Pulmonary:     Effort: Pulmonary effort is normal. No respiratory distress.     Breath sounds: Normal breath sounds.  Abdominal:     General: There is no distension.     Palpations: Abdomen is soft.     Comments: Epigastric and RUQ tenderness. Negative Murphy's. No rebound or guarding.   Skin:    General: Skin is warm and dry.  Neurological:     Mental Status: She is alert and oriented to person, place, and time.      ED Treatments / Results  Labs (all labs ordered are listed, but only abnormal results are displayed) Labs Reviewed  COMPREHENSIVE METABOLIC PANEL - Abnormal; Notable for the following  components:      Result Value   CO2 21 (*)    Calcium 8.7 (*)    Total Bilirubin 1.3 (*)    All other components within normal limits  CBC WITH DIFFERENTIAL/PLATELET  LIPASE, BLOOD    EKG None  Radiology [place] Abdomen Limited Ruq  Result Date: 08/04/2019 CLINICAL DATA:  Right upper quadrant pain EXAM: ULTRASOUND ABDOMEN LIMITED RIGHT UPPER QUADRANT COMPARISON:  None. FINDINGS: Gallbladder: Gallbladder is well distended with mild  wall thickening to 3.4 mm. No definitive gallstones are seen. Negative sonographic Murphy sign. Common bile duct: Diameter: 3.3 mm. Liver: No focal lesion identified. Within normal limits in parenchymal echogenicity. Portal vein is patent on color Doppler imaging with normal direction of blood flow towards the liver. Other: None. IMPRESSION: Mild wall thickening with negative sonographic Murphy sign. This is of uncertain clinical significance. Nonemergent HIDA scan may be helpful to assess gallbladder function. Electronically Signed   By: Alcide CleverMark  Lukens M.D.   On: 08/04/2019 21:12    Procedures Procedures (including critical care time)  Medications Ordered in ED Medications  pantoprazole (PROTONIX) EC tablet 40 mg (has no administration in time range)  clindamycin (CLEOCIN) capsule 300 mg (has no administration in time range)  ondansetron (ZOFRAN) injection 4 mg (4 mg Intravenous Given 08/04/19 2339)  fentaNYL (SUBLIMAZE) injection 50 mcg (50 mcg Intravenous Given 08/04/19 2339)     Initial Impression / Assessment and Plan / ED Course  I have reviewed the triage vital signs and the nursing notes.  Pertinent labs & imaging results that were available during my care of the patient were reviewed by me and considered in my medical decision making (see chart for details).       Maylene RoesKelly D Seelig is a 38 y.o. female who presents to ED for 2 complaints:  1. Dental pain. No abscess requiring immediate incision and drainage. Patient is afebrile, non toxic appearing, and  swallowing secretions well. Exam not concerning for Ludwig's angina or pharyngeal abscess. Will treat with clinda. I provided dental resource guide / dental referral and stressed the importance of dental follow up for ultimate management of dental pain. Patient voices understanding and is agreeable to plan.  2. Upper abdominal pain. She is afebrile with tenderness to epigastrium and RUQ. Negative murphy's. Ultrasound shows nonspecific mild wall thickening without sonographic Murphy sign.  No gallstones seen.  Again, she is afebrile with normal white count.  She has reassuring LFTs.  She has been taking 800 mg of ibuprofen 4-5 times a day.  Suspicious that etiology of this may be gastritis versus peptic ulcer due to NSAID use.  Doubt acute cholecystitis.  Recommended urgent follow-up with general surgery which she is agreeable to.  Reasons to return to the emergency department were discussed at length.  All questions answered.  Patient discussed with Dr. Elesa MassedWard who agrees with treatment plan.    Final Clinical Impressions(s) / ED Diagnoses   Final diagnoses:  RUQ pain  Thickening of wall of gallbladder  Dental infection    ED Discharge Orders         Ordered    clindamycin (CLEOCIN) 150 MG capsule  4 times daily     08/05/19 0034    pantoprazole (PROTONIX) 20 MG tablet  Daily     08/05/19 0034           Iriel Nason, Chase PicketJaime Pilcher, PA-C 08/05/19 0110    Casten Floren, Layla MawKristen N, DO 08/05/19 0214

## 2019-08-05 NOTE — Discharge Instructions (Signed)
It was my pleasure taking care of you today!   You have a dental infection. It is very important that you get evaluated by a dentist as soon as possible. Call tomorrow to schedule an appointment. Tylenol as needed for pain. Take your full course of antibiotics.   Do not take any more Ibuprofen / Advil / Aleve / Naproxen. This will make your stomach hurt worse. Take protonix daily for your stomach. Call the surgery clinic listed tomorrow to schedule a follow up appointment. I'd recommend asking them if you can follow up in their urgent follow up clinic if possible.   Return to the ER for fever, vomiting, new or worsening symptoms, any additional concerns.

## 2019-09-11 ENCOUNTER — Other Ambulatory Visit: Payer: Self-pay | Admitting: Surgery

## 2019-09-11 DIAGNOSIS — R1013 Epigastric pain: Secondary | ICD-10-CM

## 2019-09-22 ENCOUNTER — Other Ambulatory Visit: Payer: Medicaid Other

## 2021-05-13 IMAGING — US ULTRASOUND ABDOMEN LIMITED
1 series · 14 of 25 positions shown · non-contrast
Comparison: None.

CLINICAL DATA: Right upper quadrant pain

EXAM:
ULTRASOUND ABDOMEN LIMITED RIGHT UPPER QUADRANT

[Series 1: ultrasound abdomen limited · 14 of 53 slices shown]
[im 1/53]
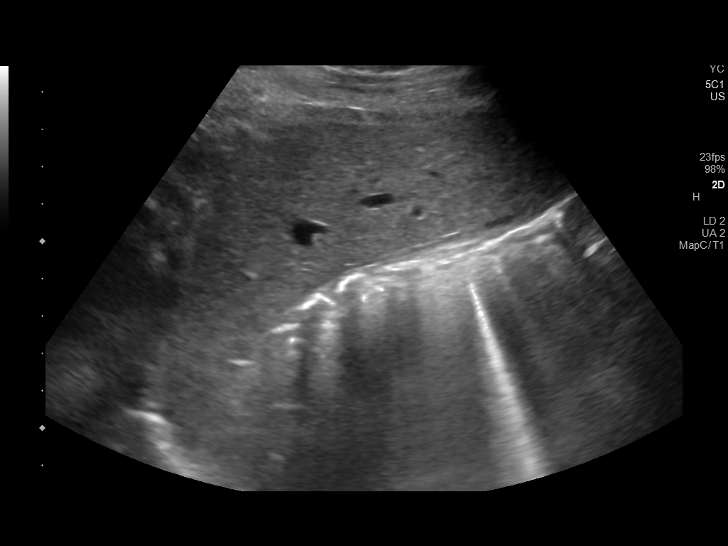
[im 5/53]
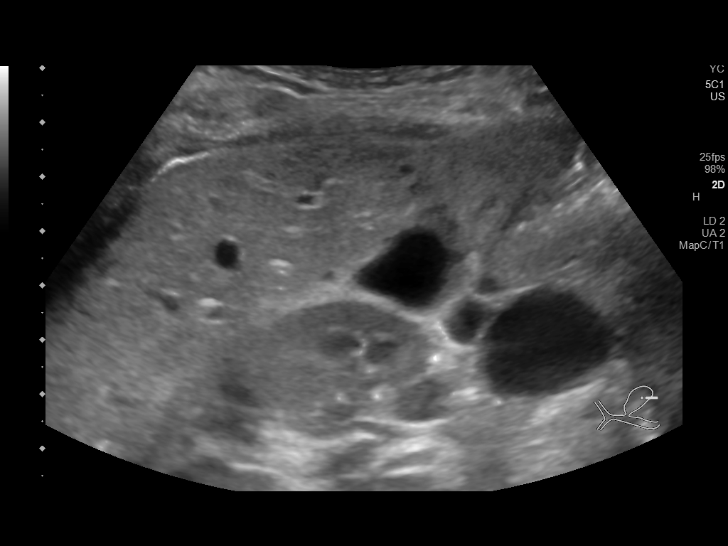
[im 9/53]
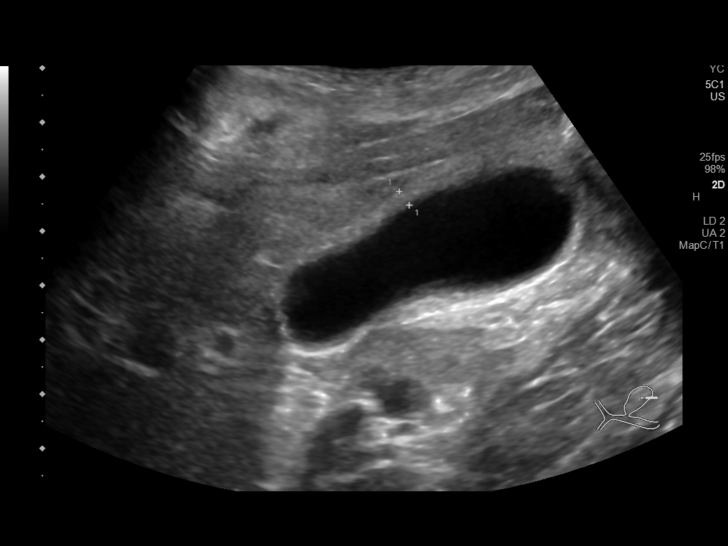
[im 14/53]
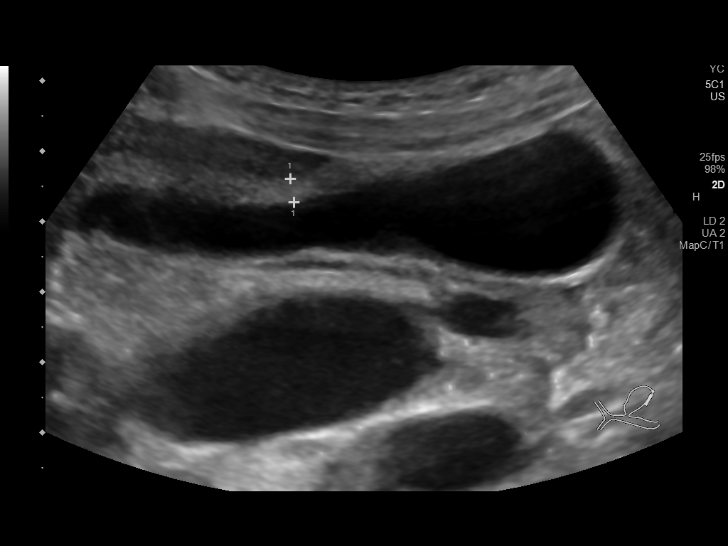
[im 18/53]
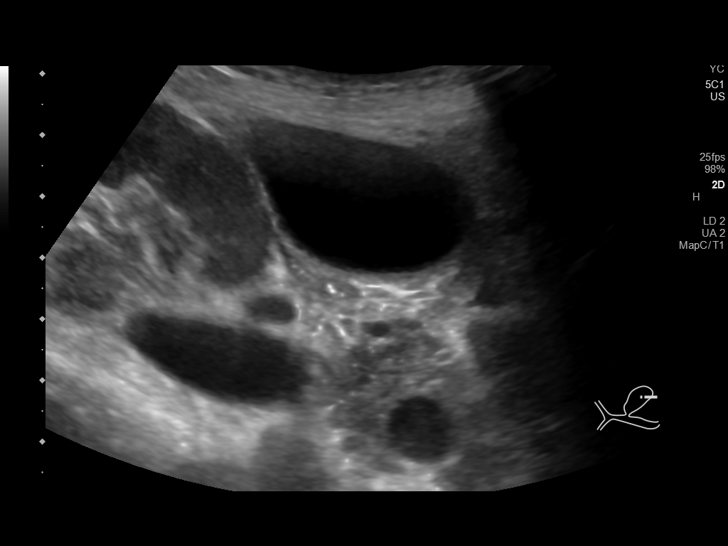
[im 20/53]
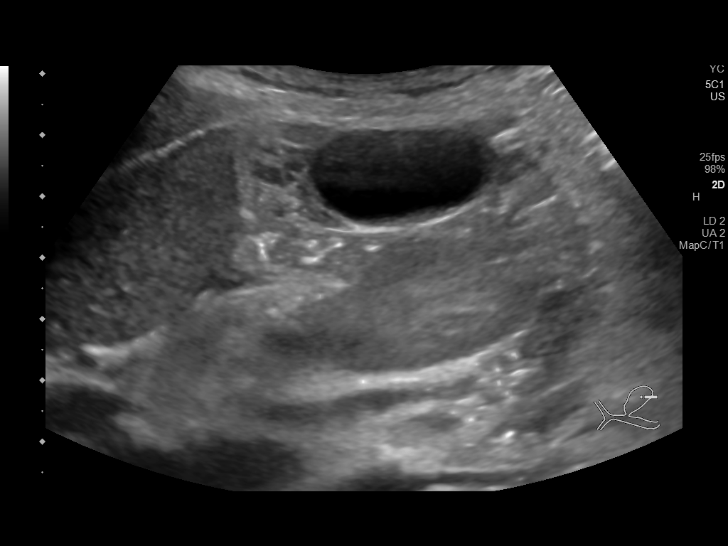
[im 24/53]
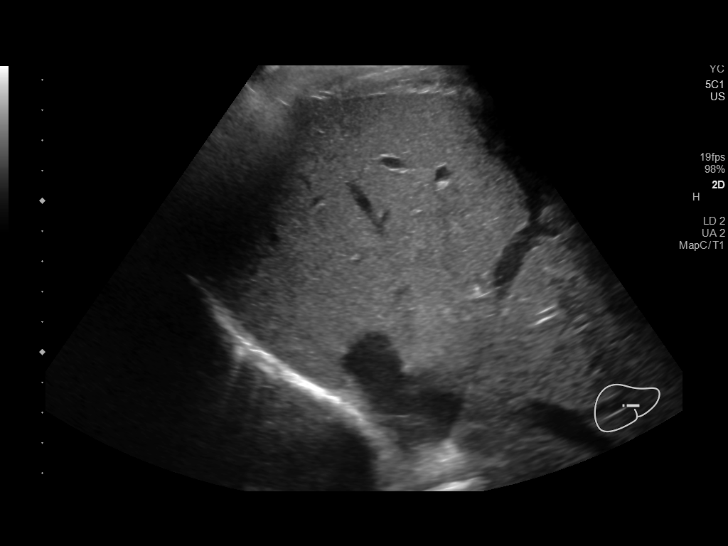
[im 29/53]
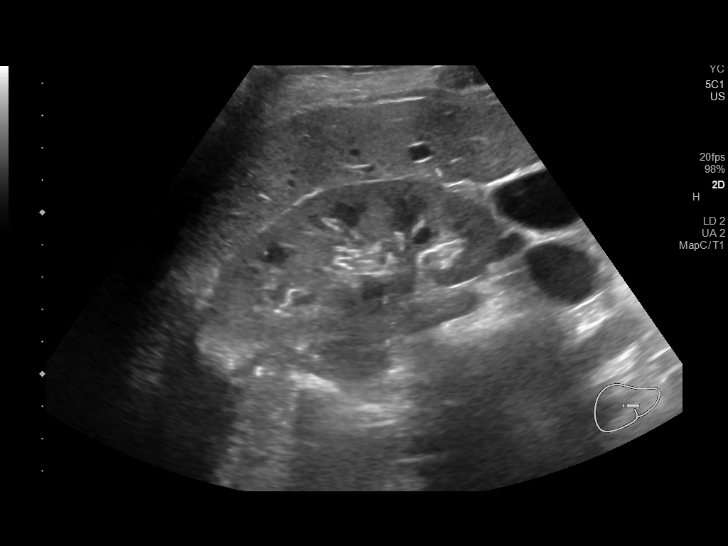
[im 33/53]
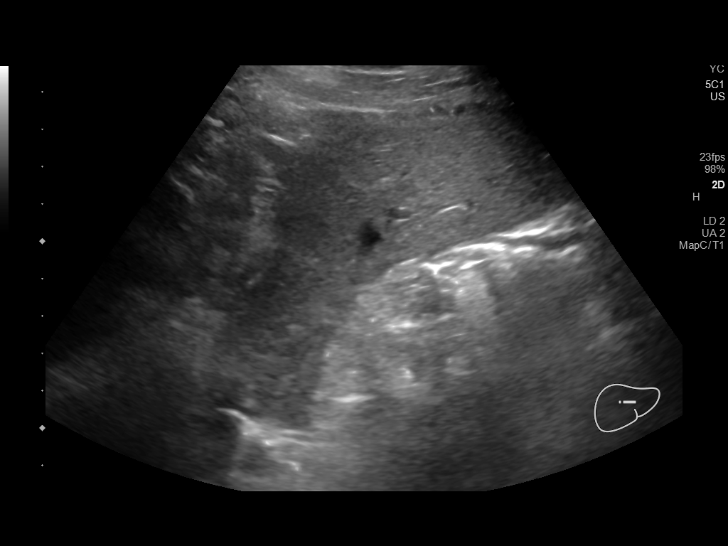
[im 35/53]
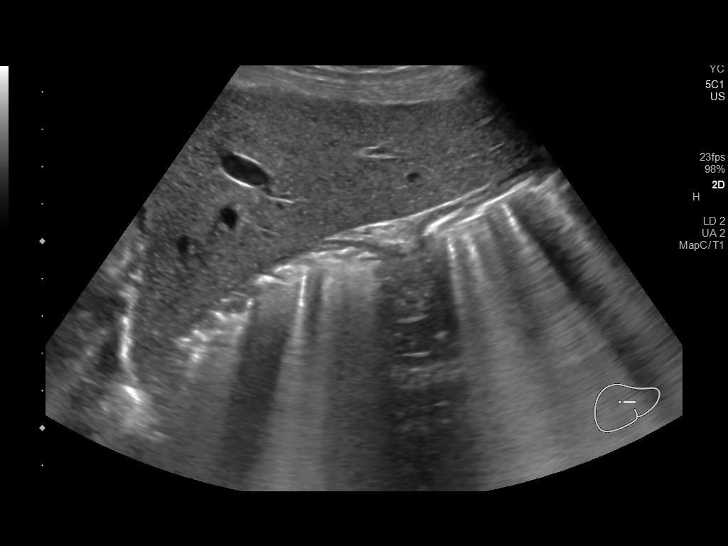
[im 40/53]
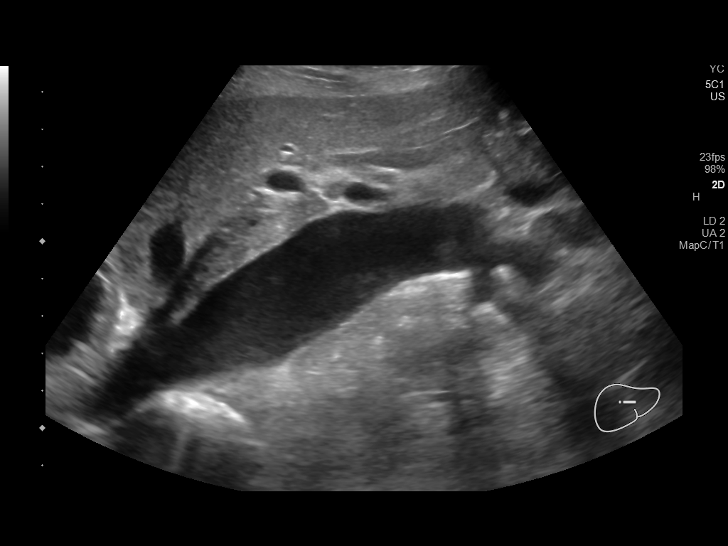
[im 44/53]
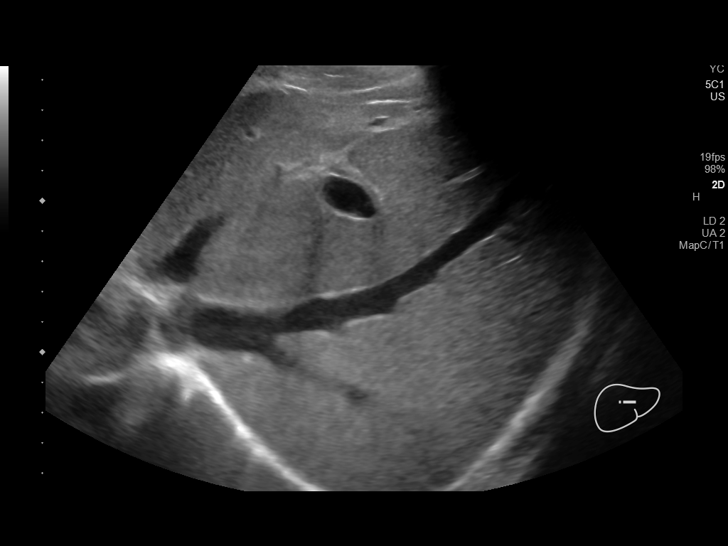
[im 48/53]
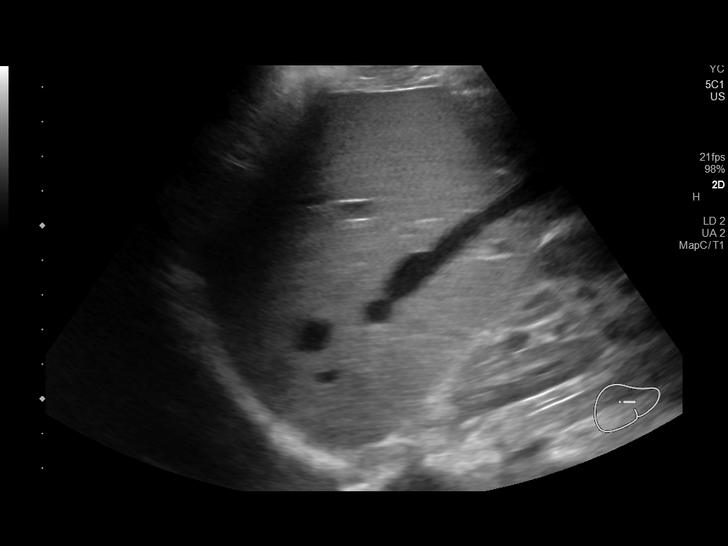
[im 53/53]
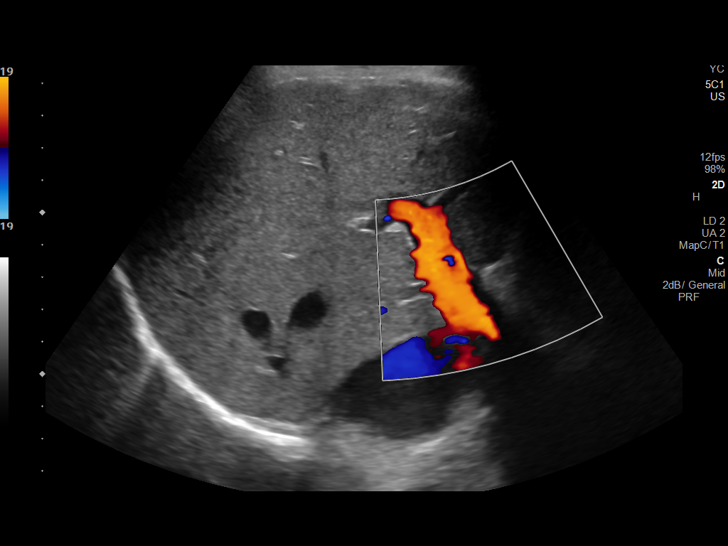

[14 of 25 positions shown; findings below may reference images not displayed]

FINDINGS: Gallbladder:

Gallbladder is well distended with mild wall thickening to 3.4 mm.
No definitive gallstones are seen. Negative sonographic Murphy sign.

Common bile duct:

Diameter: 3.3 mm.

Liver:

No focal lesion identified. Within normal limits in parenchymal
echogenicity. Portal vein is patent on color Doppler imaging with
normal direction of blood flow towards the liver.

Other: None.
IMPRESSION: Mild wall thickening with negative sonographic Murphy sign. This is
of uncertain clinical significance. Nonemergent HIDA scan may be
helpful to assess gallbladder function.

## 2022-11-07 ENCOUNTER — Encounter: Payer: Self-pay | Admitting: Pulmonary Disease

## 2022-11-07 ENCOUNTER — Ambulatory Visit (INDEPENDENT_AMBULATORY_CARE_PROVIDER_SITE_OTHER): Payer: Medicaid Other | Admitting: Pulmonary Disease

## 2022-11-07 VITALS — BP 114/78 | HR 90 | Temp 98.0°F | Ht 66.0 in | Wt 107.2 lb

## 2022-11-07 DIAGNOSIS — R0602 Shortness of breath: Secondary | ICD-10-CM | POA: Diagnosis not present

## 2022-11-07 MED ORDER — SPIRIVA RESPIMAT 2.5 MCG/ACT IN AERS: 2.0000 | INHALATION_SPRAY | Freq: Every day | RESPIRATORY_TRACT | 5 refills | Status: AC

## 2022-11-07 MED ORDER — ALBUTEROL SULFATE HFA 108 (90 BASE) MCG/ACT IN AERS: 2.0000 | INHALATION_SPRAY | Freq: Four times a day (QID) | RESPIRATORY_TRACT | 3 refills | Status: AC | PRN

## 2022-11-07 NOTE — Patient Instructions (Signed)
I will see you back in about 3 months  We will request for copy of your pulmonary function test  Continue to work on quitting smoking  We will call in inhalers for you Rescue inhaler to be used up to 4 times a day as needed Maintenance inhaler to be used on a daily basis  Call us with significant concerns  Definitely make sure you quit smoking sooner than later, your chest x-ray does show large lung volumes which may be seen in emphysema

## 2022-11-07 NOTE — Progress Notes (Signed)
Kathy [name]    [id number]    03-30-1981  Primary Care Physician:Jones, Letha Cape, NP  Referring Physician: Iona Hansen, NP [address] [place],  [place] [address]  Chief complaint:   Patient being seen for shortness of breath on exertion  HPI:  Shortness of breath with activity  Worse in the last year Has to take more breaks with activities  An active smoker was smoking a pack a day but down to half a pack a day she is trying to quit  No diagnosed lung disease, no history of asthma, no family history of asthma  Denies any chest pains or chest discomfort Nonproductive cough  Recently had chest x-ray which showed no acute infiltrate but does show hyperinflated lung fields PFT was done as well-records not available  She does not have underlying history of anxiety and depression, better with medications  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   ibuprofen (ADVIL,MOTRIN) 600 MG tablet Take 1 tablet (600 mg total) by mouth every 6 (six) hours.   norethindrone (AYGESTIN) 5 MG tablet Take 1 tablet by mouth daily.   clindamycin (CLEOCIN) 150 MG capsule Take 2 capsules (300 mg total) by mouth 4 (four) times daily.   clonazePAM (KLONOPIN) 0.5 MG tablet Take 0.5 mg by mouth daily as needed.   MELATONIN GUMMIES PO Take 5 mg by mouth at bedtime as needed (sleep). (Patient not taking: Reported on 11/07/2022)   naproxen (NAPROSYN) 375 MG tablet Take 1 tablet (375 mg total) by mouth 2 (two) times daily. (Patient not taking: Reported on 11/07/2022)   oxyCODONE (OXY IR/ROXICODONE) 5 MG immediate release tablet Take 1 tablet (5 mg total) by mouth every 4 (four) hours as needed (pain scale 4-7). (Patient not taking: Reported on  11/07/2022)   pantoprazole (PROTONIX) 20 MG tablet Take 1 tablet (20 mg total) by mouth daily. (Patient not taking: Reported on 11/07/2022)   penicillin v potassium (VEETID) 500 MG tablet Take 1 tablet (500 mg total) by mouth 3 (three) times daily. (Patient not taking: Reported on 11/07/2022)   Prenatal Vit-Fe Fumarate-FA (PRENATAL MULTIVITAMIN) TABS tablet Take 1 tablet by mouth daily at 12 noon. (Patient not taking: Reported on 11/07/2022)   No facility-administered encounter medications on file as of 11/07/2022.    Allergies as of 11/07/2022 - Review Complete 11/07/2022  Allergen Reaction Noted   Coconut (cocos nucifera) Swelling 02/25/2017   Codeine Itching 07/29/2015   Latex  02/25/2017   Tomato Swelling 02/25/2017   Hydrocodone Itching 07/20/2014    Past Medical History:  Diagnosis Date   Abnormal Pap smear    cryo/ freezing, normal pap after   Dysrhythmia    svt   Headache    Normal pregnancy in multigravida in third trimester 02/26/2017   S/P tubal ligation 02/27/2017   SVD (spontaneous vaginal delivery) 01/11/2014   SVD (spontaneous vaginal delivery) 02/27/2017   Vaginal Pap smear, abnormal     Past Surgical History:  Procedure Laterality Date   CARDIAC ELECTROPHYSIOLOGY MAPPING AND ABLATION     GYNECOLOGIC CRYOSURGERY     NO PAST SURGERIES     TUBAL LIGATION Bilateral 02/27/2017   Procedure: POST PARTUM TUBAL  LIGATION;  Surgeon: Janyth Contes, MD;  Location: [facility];  Service: Gynecology;  Laterality: Bilateral;    No family history on file.  Social History   Socioeconomic History   Marital status: Single    Spouse name: Not on file   Number of children: Not on file   Years of education: Not on file   Highest education level: Not on file  Occupational History   Not on file  Tobacco Use   Smoking status: Every Day    Packs/day: 0.50    Types: Cigarettes   Smokeless tobacco: Never   Tobacco comments:    1/2 ppd   Substance and Sexual Activity   Alcohol  use: No   Drug use: No   Sexual activity: Yes    Birth control/protection: None  Other Topics Concern   Not on file  Social History Narrative   Not on file   Social Determinants of Health   Financial Resource Strain: Not on file  Food Insecurity: Not on file  Transportation Needs: Not on file  Physical Activity: Not on file  Stress: Not on file  Social Connections: Not on file  Intimate Partner Violence: Not on file    Review of Systems  Constitutional:  Negative for fatigue.  Respiratory:  Positive for cough and shortness of breath.     Vitals:   11/07/22 0910  BP: 114/78  Pulse: 90  Temp: 98 F (36.7 C)  SpO2: 99%     Physical Exam Constitutional:      Appearance: Normal appearance.  HENT:     Head: Normocephalic.     Mouth/Throat:     Mouth: Mucous membranes are moist.  Eyes:     Conjunctiva/sclera: Conjunctivae normal.  Cardiovascular:     Rate and Rhythm: Normal rate and regular rhythm.     Heart sounds: No murmur heard.    No friction rub.  Pulmonary:     Effort: No respiratory distress.     Breath sounds: No stridor. No rhonchi or rales.  Musculoskeletal:     Cervical back: No rigidity or tenderness.  Neurological:     Mental Status: She is alert.  Psychiatric:        Mood and Affect: Mood normal.    Data Reviewed: Chest x-ray reviewed on care everywhere showing no acute infiltrate, hyperinflated lung fields  Recent PFT not available to be reviewed  Assessment:  Shortness of breath on exertion  Active smoker  Obstructive lung disease  Anxiety/depression  Pathophysiology of emphysema discussed with the patient  Plan/Recommendations: Smoking cessation counseling  We will get a copy of pulmonary function test  Initiation of bronchodilators including albuterol to be used as rescue and Spiriva for long-term management  The importance of quitting smoking was reiterated  Graded exercise as tolerated  Tentative follow-up in about 3  months  Encouraged to call with any significant concerns   Sherrilyn Rist MD [facility] 11/07/2022, 9:40 AM  CC: Berkley Harvey, NP

## 2023-03-04 ENCOUNTER — Ambulatory Visit: Payer: Medicaid Other | Admitting: Pulmonary Disease

## 2023-04-22 ENCOUNTER — Ambulatory Visit: Payer: Medicaid Other | Admitting: Pulmonary Disease

## 2023-04-29 ENCOUNTER — Telehealth: Payer: Self-pay | Admitting: Pulmonary Disease

## 2023-04-29 NOTE — Telephone Encounter (Signed)
Called the pt and there was no answer- LMTCB    

## 2023-04-29 NOTE — Telephone Encounter (Signed)
PT fear she may have a sinus infection aggravated by allergies. Pls call @ [facility] on [address].  Has tried Musinex Sinus  Pls call at [phone]

## 2023-04-30 NOTE — Telephone Encounter (Signed)
Called the pt and there was no answer- LMTCB and closing per protocol  I advised that she call and leave new msg if still needing our assistance

## 2023-05-06 ENCOUNTER — Ambulatory Visit: Payer: Medicaid Other | Admitting: Pulmonary Disease

## 2023-05-23 ENCOUNTER — Encounter: Payer: Self-pay | Admitting: Pulmonary Disease

## 2023-10-11 ENCOUNTER — Ambulatory Visit: Payer: Self-pay | Admitting: Surgery

## 2023-10-11 DIAGNOSIS — Z720 Tobacco use: Secondary | ICD-10-CM

## 2023-10-11 NOTE — H&P (Signed)
Kathy [name] [id number]   Referring Provider:  Zoe Lan   Subjective   Chief Complaint: new patient  (Abdominal hernia )     History of Present Illness:    42 year old woman with history of tubal ligation, SVT status post ablation, anxiety, and Bacot abuse who is referred for an abdominal hernia.  She noted a mass in the supraumbilical region which is tender, nonreducible.  She is known about this for about 6 months, but in the last couple of weeks has had increasing pain from the hernia.  No associated GI symptoms but recently has gained some weight after starting perimenopause medications, and has had some increasing constipation. She smokes about a pack a day for the last 15 years.  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   Review of Systems: A complete review of systems was obtained from the patient.  I have reviewed this information and discussed as appropriate with the patient.  See HPI as well for other ROS.   Medical History: Past Medical History:  Diagnosis Date   Anxiety     There is no problem list on file for this patient.   Past Surgical History:  Procedure Laterality Date   CARDIAC FOCAL ABLATION UTILIZING RADIATION THERAPY        No Known Allergies  Current Outpatient Medications on File Prior to Visit  Medication Sig Dispense Refill   sertraline (ZOLOFT) 25 MG tablet Take 1 tablet by mouth once daily     tiotropium (SPIRIVA) 18 mcg inhalation capsule Place into inhaler and inhale     busPIRone (BUSPAR) 7.5 MG tablet Take 7.5 mg by mouth 2 (two) times daily     clonazePAM (KLONOPIN) 0.5 MG tablet Take 0.5 mg by mouth once daily as needed for Anxiety     No current facility-administered medications on file prior to visit.    Family History  Problem Relation Age of Onset   Breast cancer Mother      Social History   Tobacco Use  Smoking Status Every Day   Types: Cigarettes  Smokeless  Tobacco Not on file     Social History   Socioeconomic History   Marital status: Single  Tobacco Use   Smoking status: Every Day    Types: Cigarettes  Substance and Sexual Activity   Alcohol use: Never   Drug use: Never   Social Determinants of Health   Food Insecurity: Low Risk  (08/19/2023)   Received from [facility]   Hunger Vital Sign    Worried About Running Out of Food in the Last Year: Never true    Ran Out of Food in the Last Year: Never true  Transportation Needs:  Recent Concern: Transportation Needs - Unmet Transportation Needs (08/19/2023)   Received from [facility]    In the past 12 months, has lack of reliable transportation kept you from medical appointments, meetings, work or from getting things needed for daily living? : Yes  Housing Stability: Low Risk  (08/19/2023)   Received from [employer] Stability Vital Sign    What is your living situation today?: I have a steady place to live    Think about the place you live. Do you have problems with any of the following? Choose all that apply:: None/None on this list    Objective:    Vitals:   10/11/23 1511  BP: 128/82  Pulse:  96  Temp: 37.1 C (98.7 F)  SpO2: 98%  Weight: 58.8 kg (129 lb 9.6 oz)  Height: 170.2 cm (5\' 7" )    Body mass index is 20.3 kg/m.  Gen: A&Ox3, no distress  Labored respirations Abdomen soft and nontender; she has a very small reduced but very tender umbilical hernia less than 1 cm in diameter.  Just above this is an approximately 1 cm smooth mobile but tender mass consistent with a chronically incarcerated epigastric hernia. Psych: appropriate mood and affect, normal insight/judgment intact  Skin: warm and dry    Assessment and Plan:  Diagnoses and all orders for this visit:  Ventral hernia without obstruction or gangrene  Tobacco abuse     Recommended open repair possibly with mesh depending on fascial defect size at time of surgery,  but both appear to be very small defects of anticipated primary repair.  I went over the relevant anatomy and the technique of the surgery with her.  We discussed the typical postoperative recovery timeline and activity limitations.  I reviewed risks of bleeding, infection, pain, scarring, injury to intra-abdominal structures, seroma/hematoma or other wound healing problems, undesired cosmetic result, and hernia recurrence.  Discussed cardiovascular/pulmonary/thromboembolic risks of undergoing general anesthesia.  Her risk of hernia recurrence or other complication is increased by ongoing tobacco abuse and we did discuss smoking cessation.  She understands and questions were welcomed and answered to her satisfaction.  She wishes to proceed.  Geneal Huebert Carlye Grippe, MD

## 2023-10-11 NOTE — H&P (View-Only) (Signed)
Kathy [name] [id number]   Referring Provider:  Zoe Lan   Subjective   Chief Complaint: new patient  (Abdominal hernia )     History of Present Illness:    42 year old woman with history of tubal ligation, SVT status post ablation, anxiety, and Bacot abuse who is referred for an abdominal hernia.  She noted a mass in the supraumbilical region which is tender, nonreducible.  She is known about this for about 6 months, but in the last couple of weeks has had increasing pain from the hernia.  No associated GI symptoms but recently has gained some weight after starting perimenopause medications, and has had some increasing constipation. She smokes about a pack a day for the last 15 years.  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   Review of Systems: A complete review of systems was obtained from the patient.  I have reviewed this information and discussed as appropriate with the patient.  See HPI as well for other ROS.   Medical History: Past Medical History:  Diagnosis Date   Anxiety     There is no problem list on file for this patient.   Past Surgical History:  Procedure Laterality Date   CARDIAC FOCAL ABLATION UTILIZING RADIATION THERAPY        No Known Allergies  Current Outpatient Medications on File Prior to Visit  Medication Sig Dispense Refill   sertraline (ZOLOFT) 25 MG tablet Take 1 tablet by mouth once daily     tiotropium (SPIRIVA) 18 mcg inhalation capsule Place into inhaler and inhale     busPIRone (BUSPAR) 7.5 MG tablet Take 7.5 mg by mouth 2 (two) times daily     clonazePAM (KLONOPIN) 0.5 MG tablet Take 0.5 mg by mouth once daily as needed for Anxiety     No current facility-administered medications on file prior to visit.    Family History  Problem Relation Age of Onset   Breast cancer Mother      Social History   Tobacco Use  Smoking Status Every Day   Types: Cigarettes  Smokeless  Tobacco Not on file     Social History   Socioeconomic History   Marital status: Single  Tobacco Use   Smoking status: Every Day    Types: Cigarettes  Substance and Sexual Activity   Alcohol use: Never   Drug use: Never   Social Determinants of Health   Food Insecurity: Low Risk  (08/19/2023)   Received from [facility]   Hunger Vital Sign    Worried About Running Out of Food in the Last Year: Never true    Ran Out of Food in the Last Year: Never true  Transportation Needs:  Recent Concern: Transportation Needs - Unmet Transportation Needs (08/19/2023)   Received from [facility]    In the past 12 months, has lack of reliable transportation kept you from medical appointments, meetings, work or from getting things needed for daily living? : Yes  Housing Stability: Low Risk  (08/19/2023)   Received from [employer] Stability Vital Sign    What is your living situation today?: I have a steady place to live    Think about the place you live. Do you have problems with any of the following? Choose all that apply:: None/None on this list    Objective:    Vitals:   10/11/23 1511  BP: 128/82  Pulse:  96  Temp: 37.1 C (98.7 F)  SpO2: 98%  Weight: 58.8 kg (129 lb 9.6 oz)  Height: 170.2 cm (5\' 7" )    Body mass index is 20.3 kg/m.  Gen: A&Ox3, no distress  Labored respirations Abdomen soft and nontender; she has a very small reduced but very tender umbilical hernia less than 1 cm in diameter.  Just above this is an approximately 1 cm smooth mobile but tender mass consistent with a chronically incarcerated epigastric hernia. Psych: appropriate mood and affect, normal insight/judgment intact  Skin: warm and dry    Assessment and Plan:  Diagnoses and all orders for this visit:  Ventral hernia without obstruction or gangrene  Tobacco abuse     Recommended open repair possibly with mesh depending on fascial defect size at time of surgery,  but both appear to be very small defects of anticipated primary repair.  I went over the relevant anatomy and the technique of the surgery with her.  We discussed the typical postoperative recovery timeline and activity limitations.  I reviewed risks of bleeding, infection, pain, scarring, injury to intra-abdominal structures, seroma/hematoma or other wound healing problems, undesired cosmetic result, and hernia recurrence.  Discussed cardiovascular/pulmonary/thromboembolic risks of undergoing general anesthesia.  Her risk of hernia recurrence or other complication is increased by ongoing tobacco abuse and we did discuss smoking cessation.  She understands and questions were welcomed and answered to her satisfaction.  She wishes to proceed.  Geneal Huebert Carlye Grippe, MD

## 2023-10-24 NOTE — Progress Notes (Addendum)
COVID Vaccine received:  [x]  No []  Yes Date of any COVID positive Test in last 90 days: no PCP - Zoe Lan NP Cardiologist - Dr. Lewayne Bunting  Chest x-ray - 10/28/23 Epic EKG -   Stress Test -  ECHO -  Cardiac Cath -   Bowel Prep - [x]  No  []   Yes ______  Pacemaker / ICD device [x]  No []  Yes   Spinal Cord Stimulator:[x]  No []  Yes       History of Sleep Apnea? [x]  No []  Yes   CPAP used?- [x]  No []  Yes    Does the patient monitor blood sugar?          [x]  No []  Yes  []  N/A  Patient has: [x]  NO Hx DM   []  Pre-DM                 []  DM1  []   DM2 Does patient have a [employer] or Dexacom? []  No []  Yes   Fasting Blood Sugar Ranges-  Checks Blood Sugar _____ times a day  GLP1 agonist / usual dose - no GLP1 instructions:  SGLT-2 inhibitors / usual dose - no SGLT-2 instructions:   Blood Thinner / Instructions:no Aspirin Instructions:no  Comments:   Activity level: Patient is able to climb a flight of stairs without difficulty; [x]  No CP  [x]  No SOB   Patient can /perform ADLs without assistance.   Anesthesia review: Extra muscle attached to AV node. Ablation done when 42 yo.,Smoker  Patient denies shortness of breath, fever, cough and chest pain at PAT appointment.  Patient verbalized understanding and agreement to the Pre-Surgical Instructions that were given to them at this PAT appointment. Patient was also educated of the need to review these PAT instructions again prior to his/her surgery.I reviewed the appropriate phone numbers to call if they have any and questions or concerns.

## 2023-10-25 NOTE — Patient Instructions (Signed)
SURGICAL WAITING ROOM VISITATION  Patients having surgery or a procedure may have no more than 2 support people in the waiting area - these visitors may rotate.    Children under the age of 19 must have an adult with them who is not the patient.  Due to an increase in RSV and influenza rates and associated hospitalizations, children ages 61 and under may not visit patients in [facility] hospitals.  If the patient needs to stay at the hospital during part of their recovery, the visitor guidelines for inpatient rooms apply. Pre-op nurse will coordinate an appropriate time for 1 support person to accompany patient in pre-op.  This support person may not rotate.    Please refer to the [facility] website for the visitor guidelines for Inpatients (after your surgery is over and you are in a regular room).       Your procedure is scheduled on: 11/04/23   Report to [facility] Main Entrance    Report to admitting at 5:15  AM   Call this number if you have problems the morning of surgery [phone]   Do not eat food or drink liquids  :After Midnight.      Oral Hygiene is also important to reduce your risk of infection.                                    Remember - BRUSH YOUR TEETH THE MORNING OF SURGERY WITH YOUR REGULAR TOOTHPASTE   Do NOT smoke after Midnight   Stop all vitamins and herbal supplements 7 days before surgery.   Take these medicines the morning of surgery with A SIP OF WATER: Buspar, Klonopin, norethindrone(Aygestin), Zoloft             You may not have any metal on your body including hair pins, jewelry, and body piercing             Do not wear make-up, lotions, powders, perfumes/cologne, or deodorant  Do not wear nail polish including gel and S&S, artificial/acrylic nails, or any other type of covering on natural nails including finger and toenails. If you have artificial nails, gel coating, etc. that needs to be removed by a nail salon please have this  removed prior to surgery or surgery may need to be canceled/ delayed if the surgeon/ anesthesia feels like they are unable to be safely monitored.   Do not shave  48 hours prior to surgery.    Do not bring valuables to the hospital. [facility] IS NOT             RESPONSIBLE   FOR VALUABLES.   Contacts, glasses, dentures or bridgework may not be worn into surgery.   Bring small overnight bag day of surgery.   DO NOT BRING YOUR HOME MEDICATIONS TO THE HOSPITAL. PHARMACY WILL DISPENSE MEDICATIONS LISTED ON YOUR MEDICATION LIST TO YOU DURING YOUR ADMISSION IN THE HOSPITAL!    Patients discharged on the day of surgery will not be allowed to drive home.  Someone NEEDS to stay with you for the first 24 hours after anesthesia.   Special Instructions: Bring a copy of your healthcare power of attorney and living will documents the day of surgery if you haven't scanned them before.              Please read over the following fact sheets you were given: IF YOU HAVE  QUESTIONS ABOUT YOUR PRE-OP INSTRUCTIONS PLEASE CALL [phone] Rosey [facility]   If you received a COVID test during your pre-op visit  it is requested that you wear a mask when out in public, stay away from anyone that may not be feeling well and notify your surgeon if you develop symptoms. If you test positive for Covid or have been in contact with anyone that has tested positive in the last 10 days please notify you surgeon.    [facility] - Preparing for Surgery Before surgery, you can play an important role.  Because skin is not sterile, your skin needs to be as free of germs as possible.  You can reduce the number of germs on your skin by washing with CHG (chlorahexidine gluconate) soap before surgery.  CHG is an antiseptic cleaner which kills germs and bonds with the skin to continue killing germs even after washing. Please DO NOT use if you have an allergy to CHG or antibacterial soaps.  If your skin becomes reddened/irritated stop using  the CHG and inform your nurse when you arrive at Short Stay. Do not shave (including legs and underarms) for at least 48 hours prior to the first CHG shower.  You may shave your face/neck.  Please follow these instructions carefully:  1.  Shower with CHG Soap the night before surgery and the  morning of surgery.  2.  If you choose to wash your hair, wash your hair first as usual with your normal  shampoo.  3.  After you shampoo, rinse your hair and body thoroughly to remove the shampoo.                             4.  Use CHG as you would any other liquid soap.  You can apply chg directly to the skin and wash.  Gently with a scrungie or clean washcloth.  5.  Apply the CHG Soap to your body ONLY FROM THE NECK DOWN.   Do   not use on face/ open                           Wound or open sores. Avoid contact with eyes, ears mouth and   genitals (private parts).                       Wash face,  Genitals (private parts) with your normal soap.             6.  Wash thoroughly, paying special attention to the area where your    surgery  will be performed.  7.  Thoroughly rinse your body with warm water from the neck down.  8.  DO NOT shower/wash with your normal soap after using and rinsing off the CHG Soap.                9.  Pat yourself dry with a clean towel.            10.  Wear clean pajamas.            11.  Place clean sheets on your bed the night of your first shower and do not  sleep with pets. Day of Surgery : Do not apply any lotions/deodorants the morning of surgery.  Please wear clean clothes to the hospital/surgery center.  FAILURE TO FOLLOW THESE INSTRUCTIONS MAY RESULT IN THE CANCELLATION OF YOUR  SURGERY  PATIENT SIGNATURE_________________________________  NURSE SIGNATURE__________________________________  ________________________________________________________________________

## 2023-10-28 ENCOUNTER — Encounter (HOSPITAL_COMMUNITY): Payer: Self-pay | Admitting: *Deleted

## 2023-10-28 ENCOUNTER — Other Ambulatory Visit: Payer: Self-pay

## 2023-10-28 ENCOUNTER — Ambulatory Visit (HOSPITAL_COMMUNITY)
Admission: RE | Admit: 2023-10-28 | Discharge: 2023-10-28 | Disposition: A | Discharge status: Home / Self Care | Payer: Medicaid Other | Source: Ambulatory Visit | Attending: Surgery | Admitting: Surgery

## 2023-10-28 ENCOUNTER — Encounter (HOSPITAL_COMMUNITY)
Admission: RE | Admit: 2023-10-28 | Discharge: 2023-10-28 | Disposition: A | Discharge status: Home / Self Care | Payer: Medicaid Other | Source: Ambulatory Visit | Attending: Surgery | Admitting: Surgery

## 2023-10-28 VITALS — BP 110/79 | HR 77 | Temp 98.2°F | Resp 16 | Ht 67.5 in | Wt 128.0 lb

## 2023-10-28 DIAGNOSIS — Z01811 Encounter for preprocedural respiratory examination: Secondary | ICD-10-CM | POA: Diagnosis present

## 2023-10-28 DIAGNOSIS — Z720 Tobacco use: Secondary | ICD-10-CM | POA: Diagnosis not present

## 2023-10-28 DIAGNOSIS — K469 Unspecified abdominal hernia without obstruction or gangrene: Secondary | ICD-10-CM | POA: Insufficient documentation

## 2023-10-28 DIAGNOSIS — Z01812 Encounter for preprocedural laboratory examination: Secondary | ICD-10-CM | POA: Diagnosis present

## 2023-10-28 DIAGNOSIS — Z01818 Encounter for other preprocedural examination: Secondary | ICD-10-CM | POA: Diagnosis not present

## 2023-10-28 HISTORY — DX: Anxiety disorder, unspecified: F41.9

## 2023-10-28 HISTORY — DX: Malignant (primary) neoplasm, unspecified: C80.1

## 2023-10-28 LAB — CBC
HCT: 45.5 % (ref 36.0–46.0)
Hemoglobin: 15.2 g/dL — ABNORMAL HIGH (ref 12.0–15.0)
MCH: 29.6 pg (ref 26.0–34.0)
MCHC: 33.4 g/dL (ref 30.0–36.0)
MCV: 88.7 fL (ref 80.0–100.0)
Platelets: 347 10*3/uL (ref 150–400)
RBC: 5.13 MIL/uL — ABNORMAL HIGH (ref 3.87–5.11)
RDW: 14.6 % (ref 11.5–15.5)
WBC: 11.8 10*3/uL — ABNORMAL HIGH (ref 4.0–10.5)
nRBC: 0 % (ref 0.0–0.2)

## 2023-11-03 NOTE — Anesthesia Preprocedure Evaluation (Signed)
Anesthesia Evaluation  Patient identified by MRN, date of birth, ID band Patient awake    Reviewed: Allergy & Precautions, NPO status , Patient's Chart, lab work & pertinent test results  Airway Mallampati: I  TM Distance: >3 FB Neck ROM: Full    Dental no notable dental hx. (+) Partial Lower, Partial Upper, Dental Advisory Given   Pulmonary Current Smoker and Patient abstained from smoking.   Pulmonary exam normal breath sounds clear to auscultation       Cardiovascular negative cardio ROS Normal cardiovascular exam+ dysrhythmias (hx of S/P ablation) Supra Ventricular Tachycardia  Rhythm:Regular Rate:Normal     Neuro/Psych  Headaches  Anxiety        GI/Hepatic negative GI ROS, Neg liver ROS,,,  Endo/Other  negative endocrine ROS    Renal/GU negative Renal ROS     Musculoskeletal negative musculoskeletal ROS (+)    Abdominal   Peds  Hematology Lab Results      Component                Value               Date                                HGB                      15.2 (H)            10/28/2023                HCT                      45.5                10/28/2023                     PLT                      347                 10/28/2023              Anesthesia Other Findings All: Codeine, Latex, hydrocodone.  Reproductive/Obstetrics negative OB ROS                             Anesthesia Physical Anesthesia Plan  ASA: 2  Anesthesia Plan: General   Post-op Pain Management: Precedex, Tylenol PO (pre-op)* and Toradol IV (intra-op)*   Induction: Intravenous  PONV Risk Score and Plan: 3 and Treatment may vary due to age or medical condition, Midazolam and Ondansetron  Airway Management Planned: Oral ETT  Additional Equipment: None  Intra-op Plan:   Post-operative Plan: Extubation in OR  Informed Consent: I have reviewed the patients History and Physical, chart, labs and  discussed the procedure including the risks, benefits and alternatives for the proposed anesthesia with the patient or authorized representative who has indicated his/her understanding and acceptance.     Dental advisory given  Plan Discussed with: CRNA  Anesthesia Plan Comments:         Anesthesia Quick Evaluation

## 2023-11-04 ENCOUNTER — Encounter (HOSPITAL_COMMUNITY): Payer: Self-pay | Admitting: Surgery

## 2023-11-04 ENCOUNTER — Encounter (HOSPITAL_COMMUNITY)
Admission: RE | Disposition: A | Discharge status: Home / Self Care | Payer: Self-pay | Source: Home / Self Care | Attending: Surgery

## 2023-11-04 ENCOUNTER — Other Ambulatory Visit: Payer: Self-pay

## 2023-11-04 ENCOUNTER — Other Ambulatory Visit (HOSPITAL_COMMUNITY): Payer: Self-pay

## 2023-11-04 ENCOUNTER — Ambulatory Visit (HOSPITAL_COMMUNITY): Payer: Medicaid Other | Admitting: Medical

## 2023-11-04 ENCOUNTER — Ambulatory Visit (HOSPITAL_BASED_OUTPATIENT_CLINIC_OR_DEPARTMENT_OTHER): Payer: Medicaid Other | Admitting: Anesthesiology

## 2023-11-04 ENCOUNTER — Ambulatory Visit (HOSPITAL_COMMUNITY)
Admission: RE | Admit: 2023-11-04 | Discharge: 2023-11-04 | Disposition: A | Discharge status: Home / Self Care | Payer: Medicaid Other | Attending: Surgery | Admitting: Surgery

## 2023-11-04 DIAGNOSIS — F419 Anxiety disorder, unspecified: Secondary | ICD-10-CM | POA: Diagnosis not present

## 2023-11-04 DIAGNOSIS — K429 Umbilical hernia without obstruction or gangrene: Secondary | ICD-10-CM | POA: Insufficient documentation

## 2023-11-04 DIAGNOSIS — F17210 Nicotine dependence, cigarettes, uncomplicated: Secondary | ICD-10-CM | POA: Insufficient documentation

## 2023-11-04 DIAGNOSIS — K436 Other and unspecified ventral hernia with obstruction, without gangrene: Secondary | ICD-10-CM | POA: Insufficient documentation

## 2023-11-04 HISTORY — PX: EPIGASTRIC HERNIA REPAIR: SHX404

## 2023-11-04 HISTORY — PX: UMBILICAL HERNIA REPAIR: SHX196

## 2023-11-04 LAB — POCT PREGNANCY, URINE: Preg Test, Ur: NEGATIVE

## 2023-11-04 SURGERY — REPAIR, HERNIA, EPIGASTRIC, ADULT
Anesthesia: General

## 2023-11-04 MED ORDER — MIDAZOLAM HCL 5 MG/5ML IJ SOLN
INTRAMUSCULAR | Status: DC | PRN
  Administered 2023-11-04: 2 mg via INTRAVENOUS

## 2023-11-04 MED ORDER — KETOROLAC TROMETHAMINE 30 MG/ML IJ SOLN: 30.0000 mg | Freq: Once | INTRAMUSCULAR | Status: DC | PRN

## 2023-11-04 MED ORDER — ONDANSETRON HCL 4 MG/2ML IJ SOLN
INTRAMUSCULAR | Status: DC | PRN
  Administered 2023-11-04: 4 mg via INTRAVENOUS

## 2023-11-04 MED ORDER — CHLORHEXIDINE GLUCONATE 4 % EX SOLN: 60.0000 mL | Freq: Once | CUTANEOUS | Status: DC

## 2023-11-04 MED ORDER — HYDROMORPHONE HCL 1 MG/ML IJ SOLN: 0.2500 mg | INTRAMUSCULAR | Status: DC | PRN

## 2023-11-04 MED ORDER — GABAPENTIN 300 MG PO CAPS
300.0000 mg | ORAL_CAPSULE | ORAL | Status: AC
  Administered 2023-11-04: 300 mg via ORAL
  Filled 2023-11-04: qty 1

## 2023-11-04 MED ORDER — ROCURONIUM BROMIDE 100 MG/10ML IV SOLN
INTRAVENOUS | Status: DC | PRN
  Administered 2023-11-04: 60 mg via INTRAVENOUS

## 2023-11-04 MED ORDER — CHLORHEXIDINE GLUCONATE 0.12 % MT SOLN: 15.0000 mL | Freq: Once | OROMUCOSAL | Status: DC

## 2023-11-04 MED ORDER — DEXAMETHASONE SODIUM PHOSPHATE 10 MG/ML IJ SOLN
INTRAMUSCULAR | Status: DC | PRN
  Administered 2023-11-04: 10 mg via INTRAVENOUS

## 2023-11-04 MED ORDER — PROPOFOL 10 MG/ML IV BOLUS
INTRAVENOUS | Status: AC
  Filled 2023-11-04: qty 20

## 2023-11-04 MED ORDER — CEFAZOLIN SODIUM-DEXTROSE 2-4 GM/100ML-% IV SOLN
2.0000 g | INTRAVENOUS | Status: AC
  Administered 2023-11-04: 2 g via INTRAVENOUS
  Filled 2023-11-04: qty 100

## 2023-11-04 MED ORDER — DEXMEDETOMIDINE HCL IN NACL 80 MCG/20ML IV SOLN
INTRAVENOUS | Status: DC | PRN
  Administered 2023-11-04: 4 ug via INTRAVENOUS
  Administered 2023-11-04: 8 ug via INTRAVENOUS

## 2023-11-04 MED ORDER — ONDANSETRON HCL 4 MG/2ML IJ SOLN: 4.0000 mg | Freq: Once | INTRAMUSCULAR | Status: DC | PRN

## 2023-11-04 MED ORDER — PROPOFOL 10 MG/ML IV BOLUS
INTRAVENOUS | Status: DC | PRN
  Administered 2023-11-04: 40 mg via INTRAVENOUS
  Administered 2023-11-04 (×2): 30 mg via INTRAVENOUS
  Administered 2023-11-04: 80 mg via INTRAVENOUS
  Administered 2023-11-04: 10 mg via INTRAVENOUS

## 2023-11-04 MED ORDER — BUPIVACAINE LIPOSOME 1.3 % IJ SUSP
INTRAMUSCULAR | Status: AC
  Filled 2023-11-04: qty 20

## 2023-11-04 MED ORDER — ESMOLOL HCL 100 MG/10ML IV SOLN
INTRAVENOUS | Status: AC
  Filled 2023-11-04: qty 10

## 2023-11-04 MED ORDER — ROCURONIUM BROMIDE 10 MG/ML (PF) SYRINGE
PREFILLED_SYRINGE | INTRAVENOUS | Status: AC
  Filled 2023-11-04: qty 10

## 2023-11-04 MED ORDER — SUGAMMADEX SODIUM 200 MG/2ML IV SOLN
INTRAVENOUS | Status: DC | PRN
  Administered 2023-11-04 (×2): 100 mg via INTRAVENOUS

## 2023-11-04 MED ORDER — ESMOLOL HCL 100 MG/10ML IV SOLN
INTRAVENOUS | Status: DC | PRN
  Administered 2023-11-04: 20 mg via INTRAVENOUS

## 2023-11-04 MED ORDER — PHENYLEPHRINE 80 MCG/ML (10ML) SYRINGE FOR IV PUSH (FOR BLOOD PRESSURE SUPPORT)
PREFILLED_SYRINGE | INTRAVENOUS | Status: DC | PRN
  Administered 2023-11-04: 80 ug via INTRAVENOUS
  Administered 2023-11-04: 160 ug via INTRAVENOUS
  Administered 2023-11-04 (×8): 80 ug via INTRAVENOUS

## 2023-11-04 MED ORDER — ORAL CARE MOUTH RINSE: 15.0000 mL | Freq: Once | OROMUCOSAL | Status: DC

## 2023-11-04 MED ORDER — MIDAZOLAM HCL 2 MG/2ML IJ SOLN
INTRAMUSCULAR | Status: AC
  Filled 2023-11-04: qty 2

## 2023-11-04 MED ORDER — LACTATED RINGERS IV SOLN: INTRAVENOUS | Status: DC | PRN

## 2023-11-04 MED ORDER — BUPIVACAINE-EPINEPHRINE 0.25% -1:200000 IJ SOLN
INTRAMUSCULAR | Status: AC
  Filled 2023-11-04: qty 1

## 2023-11-04 MED ORDER — BUPIVACAINE LIPOSOME 1.3 % IJ SUSP: 20.0000 mL | Freq: Once | INTRAMUSCULAR | Status: DC

## 2023-11-04 MED ORDER — OXYCODONE HCL 5 MG PO TABS
5.0000 mg | ORAL_TABLET | Freq: Three times a day (TID) | ORAL | 0 refills | Status: AC | PRN
  Filled 2023-11-04: qty 15, 5d supply, fill #0

## 2023-11-04 MED ORDER — BUPIVACAINE-EPINEPHRINE 0.25% -1:200000 IJ SOLN
INTRAMUSCULAR | Status: DC | PRN
  Administered 2023-11-04: 30 mL

## 2023-11-04 MED ORDER — LIDOCAINE HCL 1 % IJ SOLN
INTRAMUSCULAR | Status: AC
  Filled 2023-11-04: qty 20

## 2023-11-04 MED ORDER — ONDANSETRON HCL 4 MG/2ML IJ SOLN
INTRAMUSCULAR | Status: AC
Start: 2023-11-04 — End: ?
  Filled 2023-11-04: qty 2

## 2023-11-04 MED ORDER — FENTANYL CITRATE (PF) 100 MCG/2ML IJ SOLN
INTRAMUSCULAR | Status: AC
  Filled 2023-11-04: qty 2

## 2023-11-04 MED ORDER — OXYCODONE HCL 5 MG/5ML PO SOLN: 5.0000 mg | Freq: Once | ORAL | Status: DC | PRN

## 2023-11-04 MED ORDER — ACETAMINOPHEN 500 MG PO TABS
1000.0000 mg | ORAL_TABLET | ORAL | Status: AC
  Administered 2023-11-04: 1000 mg via ORAL
  Filled 2023-11-04: qty 2

## 2023-11-04 MED ORDER — FENTANYL CITRATE (PF) 100 MCG/2ML IJ SOLN
INTRAMUSCULAR | Status: DC | PRN
  Administered 2023-11-04 (×2): 50 ug via INTRAVENOUS

## 2023-11-04 MED ORDER — OXYCODONE HCL 5 MG PO TABS: 5.0000 mg | ORAL_TABLET | Freq: Once | ORAL | Status: DC | PRN

## 2023-11-04 MED ORDER — DOCUSATE SODIUM 100 MG PO CAPS
100.0000 mg | ORAL_CAPSULE | Freq: Two times a day (BID) | ORAL | 0 refills | Status: AC
Start: 2023-11-04 — End: 2023-12-04
  Filled 2023-11-04: qty 30, 15d supply, fill #0

## 2023-11-04 MED ORDER — DEXAMETHASONE SODIUM PHOSPHATE 10 MG/ML IJ SOLN
INTRAMUSCULAR | Status: AC
  Filled 2023-11-04: qty 1

## 2023-11-04 SURGICAL SUPPLY — 35 items
APL PRP STRL LF DISP 70% ISPRP (MISCELLANEOUS) ×1
APL SKNCLS STERI-STRIP NONHPOA (GAUZE/BANDAGES/DRESSINGS)
BAG COUNTER SPONGE SURGICOUNT (BAG) IMPLANT
BAG SPNG CNTER NS LX DISP (BAG)
BENZOIN TINCTURE PRP APPL 2/3 (GAUZE/BANDAGES/DRESSINGS) IMPLANT
BINDER ABDOMINAL 12 ML 46-62 (SOFTGOODS) IMPLANT
CHLORAPREP W/TINT 26 (MISCELLANEOUS) ×2 IMPLANT
COVER SURGICAL LIGHT HANDLE (MISCELLANEOUS) ×2 IMPLANT
DRAPE LAPAROSCOPIC ABDOMINAL (DRAPES) ×2 IMPLANT
DRAPE TOP 10253 STERILE (DRAPES) IMPLANT
ELECT REM PT RETURN 15FT ADLT (MISCELLANEOUS) ×2 IMPLANT
GAUZE SPONGE 4X4 12PLY STRL (GAUZE/BANDAGES/DRESSINGS) IMPLANT
GLOVE BIO SURGEON STRL SZ 6 (GLOVE) ×2 IMPLANT
GLOVE INDICATOR 6.5 STRL GRN (GLOVE) ×2 IMPLANT
GLOVE SS BIOGEL STRL SZ 6 (GLOVE) ×2 IMPLANT
GOWN STRL REUS W/ TWL LRG LVL3 (GOWN DISPOSABLE) ×2 IMPLANT
GOWN STRL REUS W/ TWL XL LVL3 (GOWN DISPOSABLE) IMPLANT
GOWN STRL REUS W/TWL LRG LVL3 (GOWN DISPOSABLE) ×1
GOWN STRL REUS W/TWL XL LVL3 (GOWN DISPOSABLE)
KIT BASIN OR (CUSTOM PROCEDURE TRAY) ×2 IMPLANT
KIT TURNOVER KIT A (KITS) IMPLANT
NDL HYPO 22X1.5 SAFETY MO (MISCELLANEOUS) IMPLANT
NEEDLE HYPO 22X1.5 SAFETY MO (MISCELLANEOUS)
PACK GENERAL/GYN (CUSTOM PROCEDURE TRAY) ×2 IMPLANT
SPIKE FLUID TRANSFER (MISCELLANEOUS) ×2 IMPLANT
STRIP CLOSURE SKIN 1/2X4 (GAUZE/BANDAGES/DRESSINGS) IMPLANT
SUT ETHIBOND 0 MO6 C/R (SUTURE) IMPLANT
SUT MNCRL AB 4-0 PS2 18 (SUTURE) ×2 IMPLANT
SUT PROLENE 2 0 CT2 30 (SUTURE) IMPLANT
SUT VIC AB 3-0 SH 27 (SUTURE) ×1
SUT VIC AB 3-0 SH 27XBRD (SUTURE) IMPLANT
SYR CONTROL 10ML LL (SYRINGE) IMPLANT
TAPE CLOTH SURG 4X10 WHT LF (GAUZE/BANDAGES/DRESSINGS) IMPLANT
TOWEL OR 17X26 10 PK STRL BLUE (TOWEL DISPOSABLE) ×2 IMPLANT
TOWEL OR NON WOVEN STRL DISP B (DISPOSABLE) ×2 IMPLANT

## 2023-11-04 NOTE — Transfer of Care (Signed)
Immediate Anesthesia Transfer of Care Note  Patient: Kathy [name]  Procedure(s) Performed: OPEN EPIGASTRIC HERNIA REPAIR OPEN UMBILICAL HERNIA REPAIR  Patient Location: PACU  Anesthesia Type:General  Level of Consciousness: sedated  Airway & Oxygen Therapy: Patient Spontanous Breathing and Patient connected to face mask oxygen  Post-op Assessment: Report given to RN and Post -op Vital signs reviewed and stable  Post vital signs: Reviewed and stable  Last Vitals:  Vitals Value Taken Time  BP 108/61 11/04/23 0850  Temp    Pulse 99 11/04/23 0853  Resp 18 11/04/23 0853  SpO2 95 % 11/04/23 0853  Vitals shown include unfiled device data.  Last Pain:  Vitals:   11/04/23 0626  TempSrc:   PainSc: 3          Complications: No notable events documented.

## 2023-11-04 NOTE — Op Note (Signed)
Operative Note  Kathy [name]  [id number]  [id number]  11/04/2023   Surgeon: Phylliss Blakes MD FACS   Procedure performed: Open primary repair of chronically incarcerated epigastric hernia containing preperitoneal fat (fascial defect 3 mm) and umbilical hernia (fascial defect 7 mm), total hernia size including both defects 5cm   Preop diagnosis: Incarcerated epigastric hernia, umbilical hernia-fascial defect not palpable preoperatively Post-op diagnosis/intraop findings: Same measurements as above   Specimens: no Retained items: no  EBL: minimal cc Complications: none   Description of procedure: After confirming informed consent the patient was taken to the operating room and placed supine on the operating room table where general endotracheal anesthesia was initiated, preoperative antibiotics were administered, SCDs applied, and a formal timeout was performed.  The abdomen was prepped and draped in usual sterile fashion.  A small vertical midline incision was made just above the umbilicus and the soft tissue dissected with cautery until the incarcerated epigastric hernia was identified.  This was skeletonized to the level of the fascia and noted to consist of preperitoneal fat which was amputated.  The fascial defect was cleared off circumferentially and measured 3 mm in diameter.  This was closed primarily with a figure-of-eight 0 Ethibond.  We then continued dissecting inferiorly towards the umbilicus. There was a distance of about 4cm between the two hernia defects. The umbilical stalk was divided from the abdominal wall with cautery and the small umbilical hernia was exposed.  The fascia was cleared off circumferentially and the hernia defect measured 7 mm; there were no incarcerated contents.  The entire hernia complex was 5cm in length including the two defects and the space between. This was closed transversely with simple interrupted 0 Ethibonds.  The umbilical stalk was sutured back down to  the fascia with a 3-0 Vicryl and the deep soft tissue along the more superior aspect of the incision was reapproximated with interrupted 3-0 Vicryl's in 2 layers.  The skin was closed with running subcuticular 4 Monocryl.  Benzoin, Steri-Strips, and a light pressure dressing of 4 x 4's and tape were applied.  The patient was then awakened, extubated and taken to PACU in stable condition.    All counts were correct at the completion of the case.

## 2023-11-04 NOTE — Anesthesia Procedure Notes (Signed)
Procedure Name: Intubation Date/Time: 11/04/2023 7:41 AM  Performed by: Maurene Capes, CRNAPre-anesthesia Checklist: Patient identified, Emergency Drugs available, Suction available and Patient being monitored Patient Re-evaluated:Patient Re-evaluated prior to induction Oxygen Delivery Method: Circle System Utilized Preoxygenation: Pre-oxygenation with 100% oxygen Induction Type: IV induction Ventilation: Mask ventilation without difficulty Laryngoscope Size: Mac and 4 Grade View: Grade III Tube type: Oral Tube size: 7.0 mm Number of attempts: 1 Airway Equipment and Method: Stylet Placement Confirmation: ETT inserted through vocal cords under direct vision, positive ETCO2 and breath sounds checked- equal and bilateral Secured at: 21 cm Tube secured with: Tape Dental Injury: Teeth and Oropharynx as per pre-operative assessment

## 2023-11-04 NOTE — Anesthesia Postprocedure Evaluation (Signed)
Anesthesia Post Note  Patient: Kathy [name]  Procedure(s) Performed: OPEN EPIGASTRIC HERNIA REPAIR OPEN UMBILICAL HERNIA REPAIR     Patient location during evaluation: PACU Anesthesia Type: General Level of consciousness: awake and alert Pain management: pain level controlled Vital Signs Assessment: post-procedure vital signs reviewed and stable Respiratory status: spontaneous breathing, nonlabored ventilation, respiratory function stable and patient connected to nasal cannula oxygen Cardiovascular status: blood pressure returned to baseline and stable Postop Assessment: no apparent nausea or vomiting Anesthetic complications: no   No notable events documented.  Last Vitals:  Vitals:   11/04/23 0945 11/04/23 0954  BP: (!) 90/54 103/78  Pulse: 64 63  Resp: (!) 21 14  Temp: (!) 36.4 C 36.4 C  SpO2: 95% 100%    Last Pain:  Vitals:   11/04/23 0954  TempSrc:   PainSc: 0-No pain                 Trevor Iha

## 2023-11-04 NOTE — Interval H&P Note (Signed)
History and Physical Interval Note:  11/04/2023 7:04 AM  Kathy [name]  has presented today for surgery, with the diagnosis of EPIGASTRIC AND UMBILICAL HERNIA.  The various methods of treatment have been discussed with the patient and family. After consideration of risks, benefits and other options for treatment, the patient has consented to  Procedure(s): OPEN EPIGASTRIC HERNIA REPAIR (N/A) OPEN UMBILICAL HERNIA REPAIR (N/A) as a surgical intervention.  The patient's history has been reviewed, patient examined, no change in status, stable for surgery.  I have reviewed the patient's chart and labs.  Questions were answered to the patient's satisfaction.     Jeanie Mccard Lollie Sails

## 2023-11-04 NOTE — Discharge Instructions (Signed)
HERNIA REPAIR: POST OP INSTRUCTIONS   EAT Gradually transition to a high fiber diet with a fiber supplement over the next few weeks after discharge.  Start with a pureed / full liquid diet (see below)  WALK Walk an hour a day (cumulative- not all at once).  Control your pain to do that.    CONTROL PAIN Control pain so that you can walk, sleep, tolerate sneezing/coughing, and go up/down stairs.  HAVE A BOWEL MOVEMENT DAILY Keep your bowels regular to avoid problems.  OK to try a laxative to override constipation.  OK to use an antidiarrheal to slow down diarrhea.  Call if not better after 2 tries  CALL IF YOU HAVE PROBLEMS/CONCERNS Call if you are still struggling despite following these instructions. Call if you have concerns not answered by these instructions  ######################################################################    DIET: Follow a light bland diet & liquids the first 24 hours after arrival home, such as soup, liquids, starches, etc.  Be sure to drink plenty of fluids.  Quickly advance to a usual solid diet within a few days.  Avoid fast food or heavy meals initially as you are more likely to get nauseated or have irregular bowels.  A low-sugar, high-fiber diet for the rest of your life is ideal.   Take your usually prescribed home medications unless otherwise directed.  PAIN CONTROL: Pain is best controlled by a usual combination of three different methods TOGETHER: Ice/Heat Over the counter pain medication Prescription pain medication Most patients will experience some swelling and bruising around the hernia(s) such as the bellybutton, groins, or old incisions.  Ice packs or heating pads (30-60 minutes up to 6 times a day) will help. Use ice for the first few days to help decrease swelling and bruising, then switch to heat to help relax tight/sore spots and speed recovery.  Some people prefer to use ice alone, heat alone, alternating between ice & heat.  Experiment  to what works for you.  Swelling and bruising can take several weeks to resolve.   It is helpful to take an over-the-counter pain medication regularly for the first days: Naproxen (Aleve, etc)  Two 220mg  tabs twice a day OR Ibuprofen (Advil, etc) Three 200mg  tabs four times a day (every meal & bedtime) AND Acetaminophen (Tylenol, etc) 325-650mg  four times a day (every meal & bedtime) A  prescription for pain medication should be given to you upon discharge.  Take your pain medication as prescribed, IF NEEDED.  If you are having problems/concerns with the prescription medicine (does not control pain, nausea, vomiting, rash, itching, etc), please call us [phone] to see if we need to switch you to a different pain medicine that will work better for you and/or control your side effect better. If you need a refill on your pain medication, please contact your pharmacy.  They will contact our office to request authorization. Prescriptions will not be filled after 5 pm or on week-ends.  Avoid getting constipated.  Between the surgery and the pain medications, it is common to experience some constipation.  Increasing fluid intake and taking a fiber supplement (such as Metamucil, Citrucel, FiberCon, MiraLax, etc) 1-2 times a day regularly will usually help prevent this problem from occurring.  A mild laxative (prune juice, Milk of Magnesia, MiraLax, etc) should be taken according to package directions if there are no bowel movements after 48 hours.    Wash / shower every day, starting 2 days after surgery.  You may shower over  the steri strips (white tapes) which are waterproof.  No rubbing, scrubbing, lotions or ointments to incision(s). Do not soak or submerge.   Remove your outer bandage 2 days after surgery. Steri strips (white tapes) will peel off after 1-2 weeks.  You may leave the incision open to air.  You may replace a dressing/Band-Aid to cover an incision for comfort if you wish.  Continue to  shower over incision(s) after the dressing is off.  ACTIVITIES as tolerated:   You may resume regular (light) daily activities beginning the next day--such as daily self-care, walking, climbing stairs--gradually increasing activities as tolerated.  Control your pain so that you can walk an hour a day.  If you can walk 30 minutes without difficulty, it is safe to try more intense activity such as jogging, treadmill, bicycling, low-impact aerobics, swimming, etc. Refrain from the most intensive and strenuous activity such as sit-ups, heavy lifting, contact sports, etc  Refrain from any heavy lifting or straining until 6 weeks after surgery.   DO NOT PUSH THROUGH PAIN.  Let pain be your guide: If it hurts to do something, don't do it.  Pain is your body warning you to avoid that activity for another week until the pain goes down. You may drive when you are no longer taking prescription pain medication, you can comfortably wear a seatbelt, and you can safely maneuver your car and apply brakes. You may have sexual intercourse when it is comfortable.   FOLLOW UP in our office Please call [facility] at [phone] to set up an appointment to see your surgeon in the office for a follow-up appointment approximately 2-3 weeks after your surgery. Make sure that you call for this appointment the day you arrive home to insure a convenient appointment time.  9.  If you have disability of FMLA / Family leave forms, please bring the forms to the office for processing.  (do not give to your surgeon).  WHEN TO CALL us [phone]: Poor pain control Reactions / problems with new medications (rash/itching, nausea, etc)  Fever over 101.5 F (38.5 C) Inability to urinate Nausea and/or vomiting Worsening swelling or bruising Continued bleeding from incision. Increased pain, redness, or drainage from the incision   The clinic staff is available to answer your questions during regular business hours (8:30am-5pm).   Please don't hesitate to call and ask to speak to one of our nurses for clinical concerns.   If you have a medical emergency, go to the nearest emergency room or call 911.  A surgeon from [facility] is always on call at the hospitals in [facility], [place] [address], [place], [place]  [address] ?  [address] [address], [place], [place]   [address] MAIN: [phone] ? TOLL FREE: [phone] ? FAX: [phone] [email or web address]

## 2023-11-05 ENCOUNTER — Encounter (HOSPITAL_COMMUNITY): Payer: Self-pay | Admitting: Surgery

## 2024-03-08 ENCOUNTER — Encounter: Payer: Self-pay | Admitting: Pulmonary Disease
